# Patient Record
Sex: Female | Born: 1985 | Race: White | Hispanic: No | Marital: Single | Smoking: Current every day smoker
Health system: Southern US, Community
[De-identification: ages and names within clinical notes are randomized; demographics above are authoritative.]

## PROBLEM LIST (undated history)

## (undated) DIAGNOSIS — F1910 Other psychoactive substance abuse, uncomplicated: Secondary | ICD-10-CM

## (undated) DIAGNOSIS — I10 Essential (primary) hypertension: Secondary | ICD-10-CM

## (undated) DIAGNOSIS — E282 Polycystic ovarian syndrome: Secondary | ICD-10-CM

## (undated) DIAGNOSIS — M419 Scoliosis, unspecified: Secondary | ICD-10-CM

## (undated) DIAGNOSIS — F1120 Opioid dependence, uncomplicated: Secondary | ICD-10-CM

## (undated) DIAGNOSIS — M25569 Pain in unspecified knee: Secondary | ICD-10-CM

## (undated) DIAGNOSIS — M549 Dorsalgia, unspecified: Secondary | ICD-10-CM

## (undated) DIAGNOSIS — G8929 Other chronic pain: Secondary | ICD-10-CM

## (undated) HISTORY — PX: GANGLION CYST EXCISION: SHX1691

## (undated) HISTORY — PX: MULTIPLE TOOTH EXTRACTIONS: SHX2053

## (undated) HISTORY — PX: KNEE SURGERY: SHX244

## (undated) HISTORY — DX: Polycystic ovarian syndrome: E28.2

---
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
## 2004-01-27 ENCOUNTER — Emergency Department (HOSPITAL_COMMUNITY): Admission: EM | Admit: 2004-01-27 | Discharge: 2004-01-27 | Payer: Self-pay | Admitting: Emergency Medicine

## 2004-03-16 ENCOUNTER — Emergency Department (HOSPITAL_COMMUNITY): Admission: EM | Admit: 2004-03-16 | Discharge: 2004-03-16 | Payer: Self-pay | Admitting: Emergency Medicine

## 2004-06-20 ENCOUNTER — Emergency Department (HOSPITAL_COMMUNITY): Admission: EM | Admit: 2004-06-20 | Discharge: 2004-06-20 | Payer: Self-pay | Admitting: Emergency Medicine

## 2004-10-13 ENCOUNTER — Emergency Department (HOSPITAL_COMMUNITY): Admission: EM | Admit: 2004-10-13 | Discharge: 2004-10-14 | Payer: Self-pay | Admitting: Emergency Medicine

## 2004-11-28 ENCOUNTER — Emergency Department (HOSPITAL_COMMUNITY): Admission: EM | Admit: 2004-11-28 | Discharge: 2004-11-28 | Payer: Self-pay | Admitting: Emergency Medicine

## 2004-12-10 ENCOUNTER — Emergency Department (HOSPITAL_COMMUNITY): Admission: EM | Admit: 2004-12-10 | Discharge: 2004-12-10 | Payer: Self-pay | Admitting: Emergency Medicine

## 2005-01-20 ENCOUNTER — Emergency Department (HOSPITAL_COMMUNITY): Admission: EM | Admit: 2005-01-20 | Discharge: 2005-01-20 | Payer: Self-pay | Admitting: Emergency Medicine

## 2005-01-23 ENCOUNTER — Ambulatory Visit: Payer: Self-pay | Admitting: Internal Medicine

## 2005-01-31 ENCOUNTER — Ambulatory Visit: Payer: Self-pay | Admitting: Internal Medicine

## 2005-02-01 ENCOUNTER — Emergency Department (HOSPITAL_COMMUNITY): Admission: EM | Admit: 2005-02-01 | Discharge: 2005-02-01 | Payer: Self-pay | Admitting: Emergency Medicine

## 2005-02-07 ENCOUNTER — Ambulatory Visit (HOSPITAL_BASED_OUTPATIENT_CLINIC_OR_DEPARTMENT_OTHER): Admission: RE | Admit: 2005-02-07 | Discharge: 2005-02-07 | Payer: Self-pay | Admitting: Otolaryngology

## 2005-02-07 ENCOUNTER — Ambulatory Visit (HOSPITAL_COMMUNITY): Admission: RE | Admit: 2005-02-07 | Discharge: 2005-02-07 | Payer: Self-pay | Admitting: Otolaryngology

## 2005-02-08 ENCOUNTER — Emergency Department (HOSPITAL_COMMUNITY): Admission: EM | Admit: 2005-02-08 | Discharge: 2005-02-08 | Payer: Self-pay | Admitting: *Deleted

## 2005-03-02 ENCOUNTER — Emergency Department (HOSPITAL_COMMUNITY): Admission: EM | Admit: 2005-03-02 | Discharge: 2005-03-02 | Payer: Self-pay | Admitting: Emergency Medicine

## 2005-03-09 ENCOUNTER — Emergency Department (HOSPITAL_COMMUNITY): Admission: EM | Admit: 2005-03-09 | Discharge: 2005-03-09 | Payer: Self-pay | Admitting: Emergency Medicine

## 2005-04-29 ENCOUNTER — Emergency Department (HOSPITAL_COMMUNITY): Admission: EM | Admit: 2005-04-29 | Discharge: 2005-04-29 | Payer: Self-pay | Admitting: Emergency Medicine

## 2005-07-22 ENCOUNTER — Emergency Department (HOSPITAL_COMMUNITY): Admission: EM | Admit: 2005-07-22 | Discharge: 2005-07-22 | Payer: Self-pay | Admitting: Family Medicine

## 2005-08-11 ENCOUNTER — Emergency Department (HOSPITAL_COMMUNITY): Admission: EM | Admit: 2005-08-11 | Discharge: 2005-08-11 | Payer: Self-pay | Admitting: Family Medicine

## 2005-08-21 ENCOUNTER — Emergency Department (HOSPITAL_COMMUNITY): Admission: EM | Admit: 2005-08-21 | Discharge: 2005-08-21 | Payer: Self-pay | Admitting: Emergency Medicine

## 2005-09-09 ENCOUNTER — Emergency Department (HOSPITAL_COMMUNITY): Admission: EM | Admit: 2005-09-09 | Discharge: 2005-09-09 | Payer: Self-pay | Admitting: Emergency Medicine

## 2005-09-19 ENCOUNTER — Ambulatory Visit: Payer: Self-pay | Admitting: Family Medicine

## 2005-09-26 ENCOUNTER — Emergency Department (HOSPITAL_COMMUNITY): Admission: EM | Admit: 2005-09-26 | Discharge: 2005-09-26 | Payer: Self-pay | Admitting: Family Medicine

## 2005-09-26 ENCOUNTER — Ambulatory Visit: Payer: Self-pay | Admitting: Family Medicine

## 2005-09-29 ENCOUNTER — Ambulatory Visit: Payer: Self-pay | Admitting: Family Medicine

## 2005-10-09 ENCOUNTER — Ambulatory Visit: Payer: Self-pay | Admitting: Internal Medicine

## 2005-11-25 ENCOUNTER — Emergency Department (HOSPITAL_COMMUNITY): Admission: EM | Admit: 2005-11-25 | Discharge: 2005-11-25 | Payer: Self-pay | Admitting: Emergency Medicine

## 2005-12-22 ENCOUNTER — Ambulatory Visit: Payer: Self-pay | Admitting: Family Medicine

## 2006-01-16 ENCOUNTER — Encounter: Payer: Self-pay | Admitting: Family Medicine

## 2006-01-16 ENCOUNTER — Ambulatory Visit: Payer: Self-pay | Admitting: Family Medicine

## 2006-02-13 ENCOUNTER — Ambulatory Visit: Payer: Self-pay | Admitting: Internal Medicine

## 2006-03-10 ENCOUNTER — Ambulatory Visit: Payer: Self-pay | Admitting: Family Medicine

## 2006-03-13 ENCOUNTER — Ambulatory Visit (HOSPITAL_COMMUNITY): Admission: RE | Admit: 2006-03-13 | Discharge: 2006-03-13 | Payer: Self-pay | Admitting: Family Medicine

## 2006-03-16 ENCOUNTER — Ambulatory Visit: Payer: Self-pay | Admitting: Family Medicine

## 2006-04-21 ENCOUNTER — Ambulatory Visit: Payer: Self-pay | Admitting: Family Medicine

## 2006-05-06 ENCOUNTER — Ambulatory Visit: Payer: Self-pay | Admitting: *Deleted

## 2006-05-20 ENCOUNTER — Ambulatory Visit: Payer: Self-pay | Admitting: Internal Medicine

## 2006-06-03 ENCOUNTER — Emergency Department (HOSPITAL_COMMUNITY): Admission: EM | Admit: 2006-06-03 | Discharge: 2006-06-03 | Payer: Self-pay | Admitting: Emergency Medicine

## 2006-06-08 ENCOUNTER — Ambulatory Visit: Payer: Self-pay | Admitting: Family Medicine

## 2006-06-11 ENCOUNTER — Ambulatory Visit: Payer: Self-pay | Admitting: Family Medicine

## 2006-08-05 ENCOUNTER — Emergency Department (HOSPITAL_COMMUNITY): Admission: EM | Admit: 2006-08-05 | Discharge: 2006-08-05 | Payer: Self-pay | Admitting: Emergency Medicine

## 2006-08-06 ENCOUNTER — Ambulatory Visit: Payer: Self-pay | Admitting: Internal Medicine

## 2006-08-16 ENCOUNTER — Emergency Department (HOSPITAL_COMMUNITY): Admission: EM | Admit: 2006-08-16 | Discharge: 2006-08-16 | Payer: Self-pay | Admitting: Emergency Medicine

## 2006-08-19 ENCOUNTER — Encounter: Admission: RE | Admit: 2006-08-19 | Discharge: 2006-10-15 | Payer: Self-pay | Admitting: Family Medicine

## 2006-08-19 ENCOUNTER — Encounter: Payer: Self-pay | Admitting: Family Medicine

## 2006-08-22 ENCOUNTER — Emergency Department (HOSPITAL_COMMUNITY): Admission: EM | Admit: 2006-08-22 | Discharge: 2006-08-22 | Payer: Self-pay | Admitting: Emergency Medicine

## 2006-09-05 ENCOUNTER — Ambulatory Visit (HOSPITAL_COMMUNITY): Admission: RE | Admit: 2006-09-05 | Discharge: 2006-09-05 | Payer: Self-pay | Admitting: Specialist

## 2006-09-06 ENCOUNTER — Emergency Department (HOSPITAL_COMMUNITY): Admission: EM | Admit: 2006-09-06 | Discharge: 2006-09-06 | Payer: Self-pay | Admitting: Emergency Medicine

## 2006-10-15 ENCOUNTER — Encounter: Payer: Self-pay | Admitting: Family Medicine

## 2006-11-07 ENCOUNTER — Emergency Department (HOSPITAL_COMMUNITY): Admission: EM | Admit: 2006-11-07 | Discharge: 2006-11-07 | Payer: Self-pay | Admitting: Family Medicine

## 2006-11-26 ENCOUNTER — Emergency Department (HOSPITAL_COMMUNITY): Admission: EM | Admit: 2006-11-26 | Discharge: 2006-11-26 | Payer: Self-pay | Admitting: Emergency Medicine

## 2007-01-27 ENCOUNTER — Encounter (INDEPENDENT_AMBULATORY_CARE_PROVIDER_SITE_OTHER): Payer: Self-pay | Admitting: *Deleted

## 2007-02-02 DIAGNOSIS — Z87891 Personal history of nicotine dependence: Secondary | ICD-10-CM | POA: Insufficient documentation

## 2007-02-02 DIAGNOSIS — I10 Essential (primary) hypertension: Secondary | ICD-10-CM | POA: Insufficient documentation

## 2007-02-02 DIAGNOSIS — E282 Polycystic ovarian syndrome: Secondary | ICD-10-CM | POA: Insufficient documentation

## 2007-02-02 DIAGNOSIS — J45909 Unspecified asthma, uncomplicated: Secondary | ICD-10-CM | POA: Insufficient documentation

## 2007-02-02 DIAGNOSIS — F319 Bipolar disorder, unspecified: Secondary | ICD-10-CM | POA: Insufficient documentation

## 2007-02-02 DIAGNOSIS — F1910 Other psychoactive substance abuse, uncomplicated: Secondary | ICD-10-CM | POA: Insufficient documentation

## 2007-02-22 ENCOUNTER — Emergency Department (HOSPITAL_COMMUNITY): Admission: EM | Admit: 2007-02-22 | Discharge: 2007-02-22 | Payer: Self-pay | Admitting: Emergency Medicine

## 2007-04-01 ENCOUNTER — Ambulatory Visit: Payer: Self-pay | Admitting: Internal Medicine

## 2007-04-01 DIAGNOSIS — J309 Allergic rhinitis, unspecified: Secondary | ICD-10-CM | POA: Insufficient documentation

## 2007-04-01 DIAGNOSIS — IMO0002 Reserved for concepts with insufficient information to code with codable children: Secondary | ICD-10-CM | POA: Insufficient documentation

## 2007-04-01 DIAGNOSIS — J019 Acute sinusitis, unspecified: Secondary | ICD-10-CM | POA: Insufficient documentation

## 2007-04-11 ENCOUNTER — Emergency Department (HOSPITAL_COMMUNITY): Admission: EM | Admit: 2007-04-11 | Discharge: 2007-04-11 | Payer: Self-pay | Admitting: Emergency Medicine

## 2007-04-11 IMAGING — CR DG KNEE COMPLETE 4+V*R*
4 series · 4 of 4 positions shown · non-contrast
Comparison: None available.

CLINICAL DATA: 21-year-old who fell.  Right knee pain.  History of an ACL repair. 
 RIGHT KNEE ? 4 VIEW:

[view not recorded (1 of 4)]
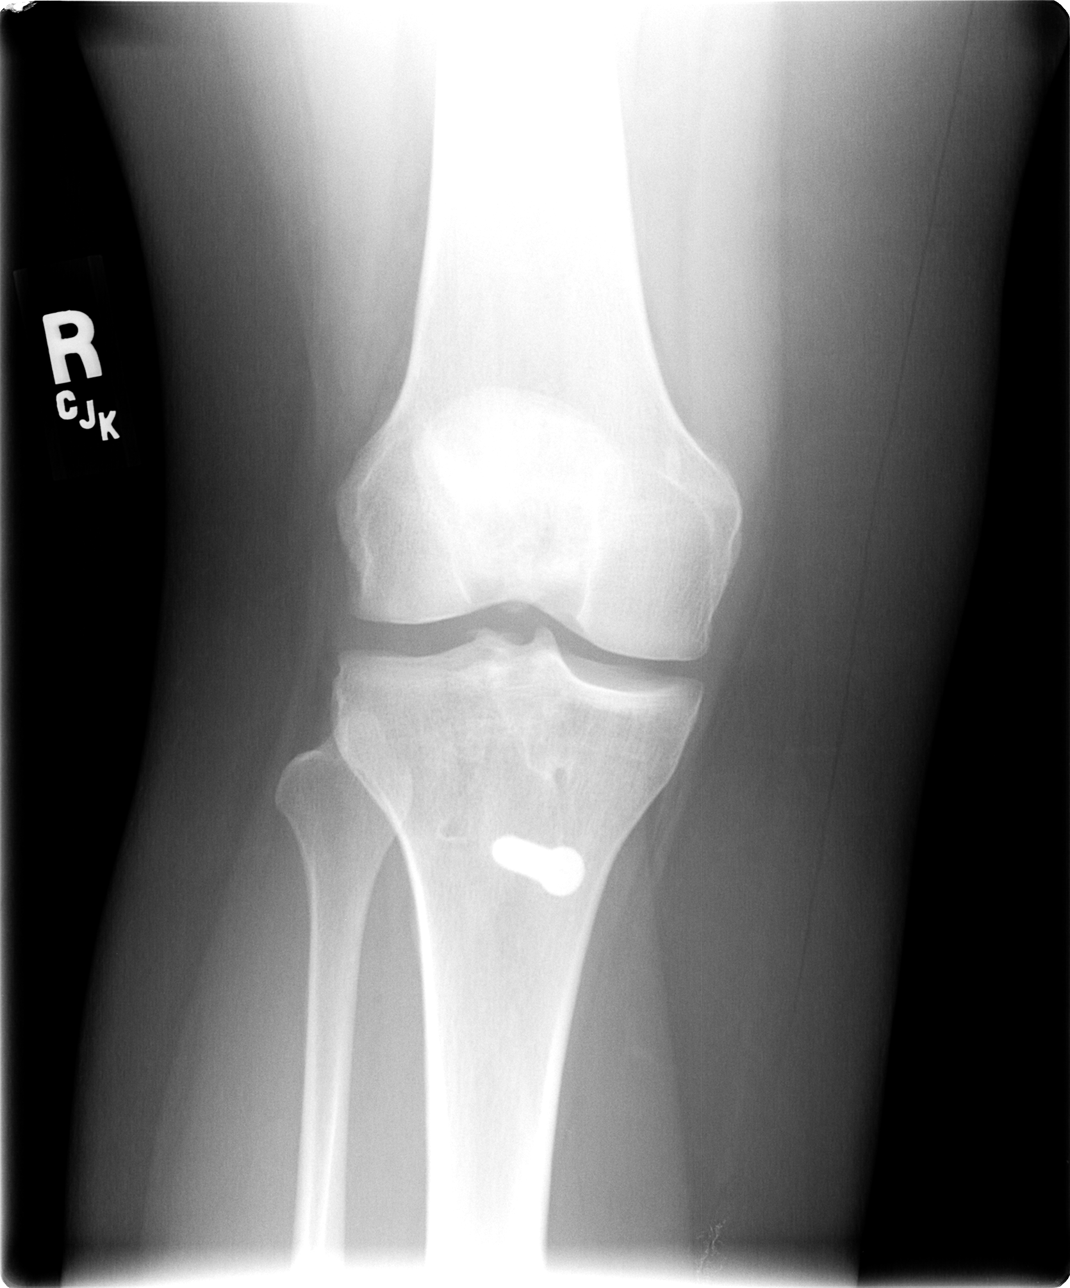

[view not recorded (2 of 4)]
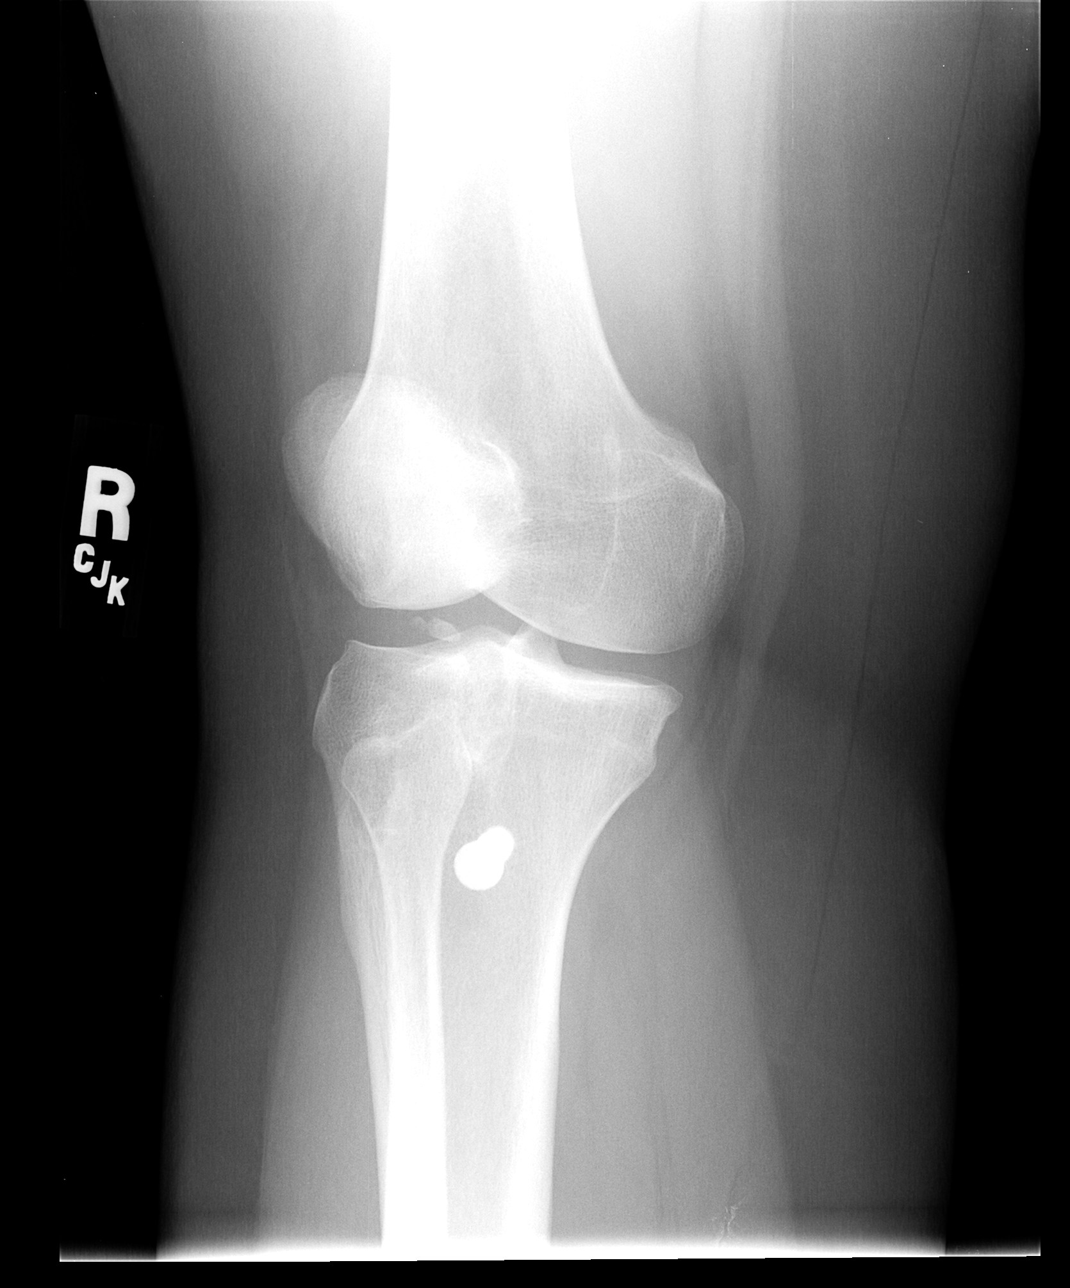

[view not recorded (3 of 4)]
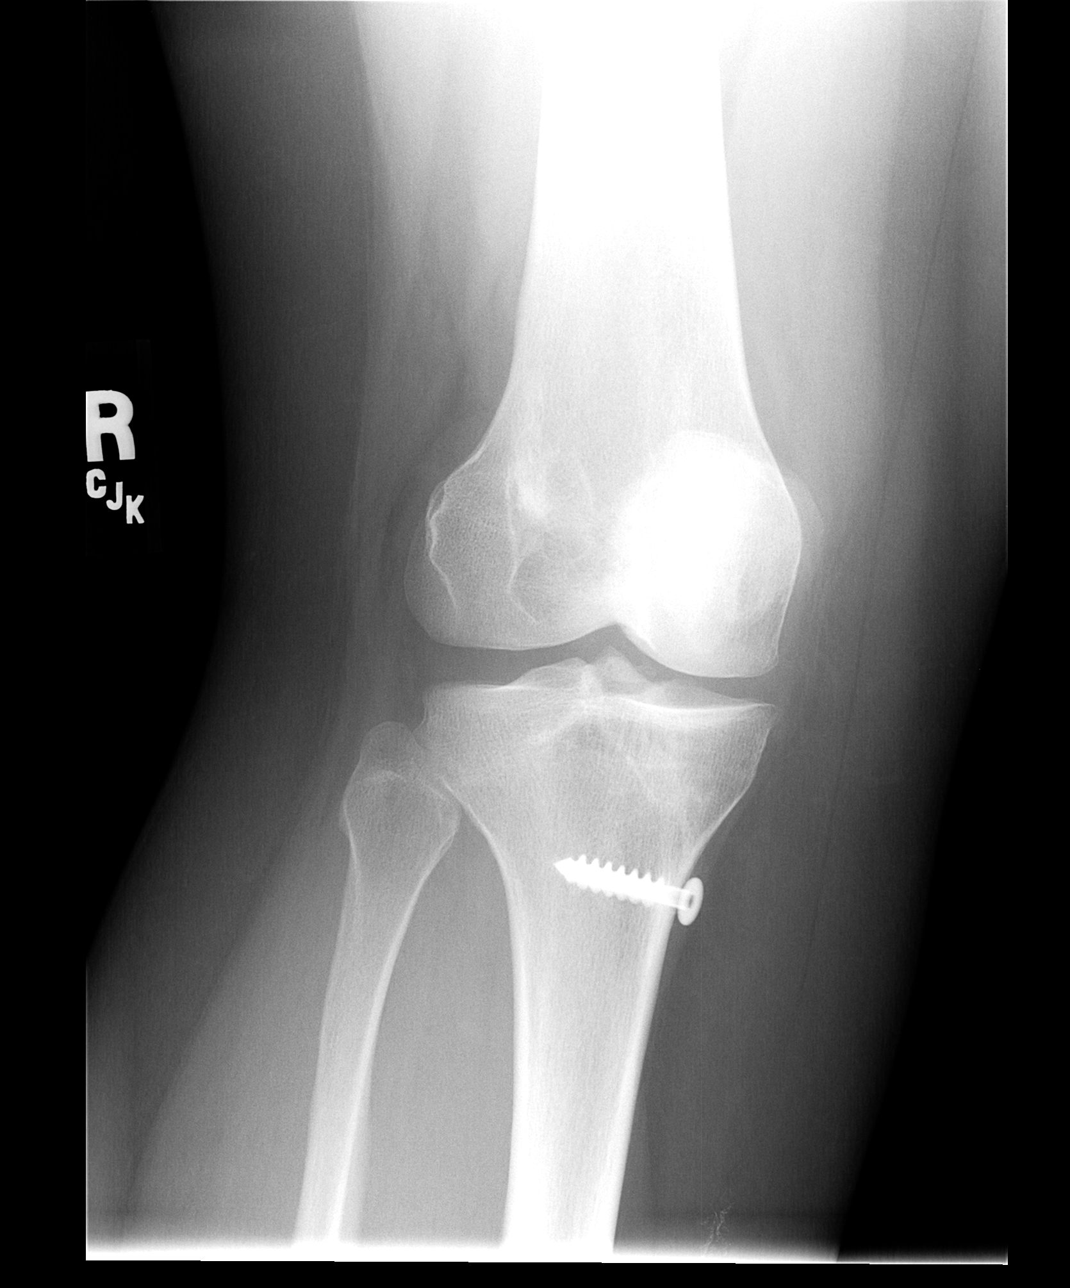

[view not recorded (4 of 4)]
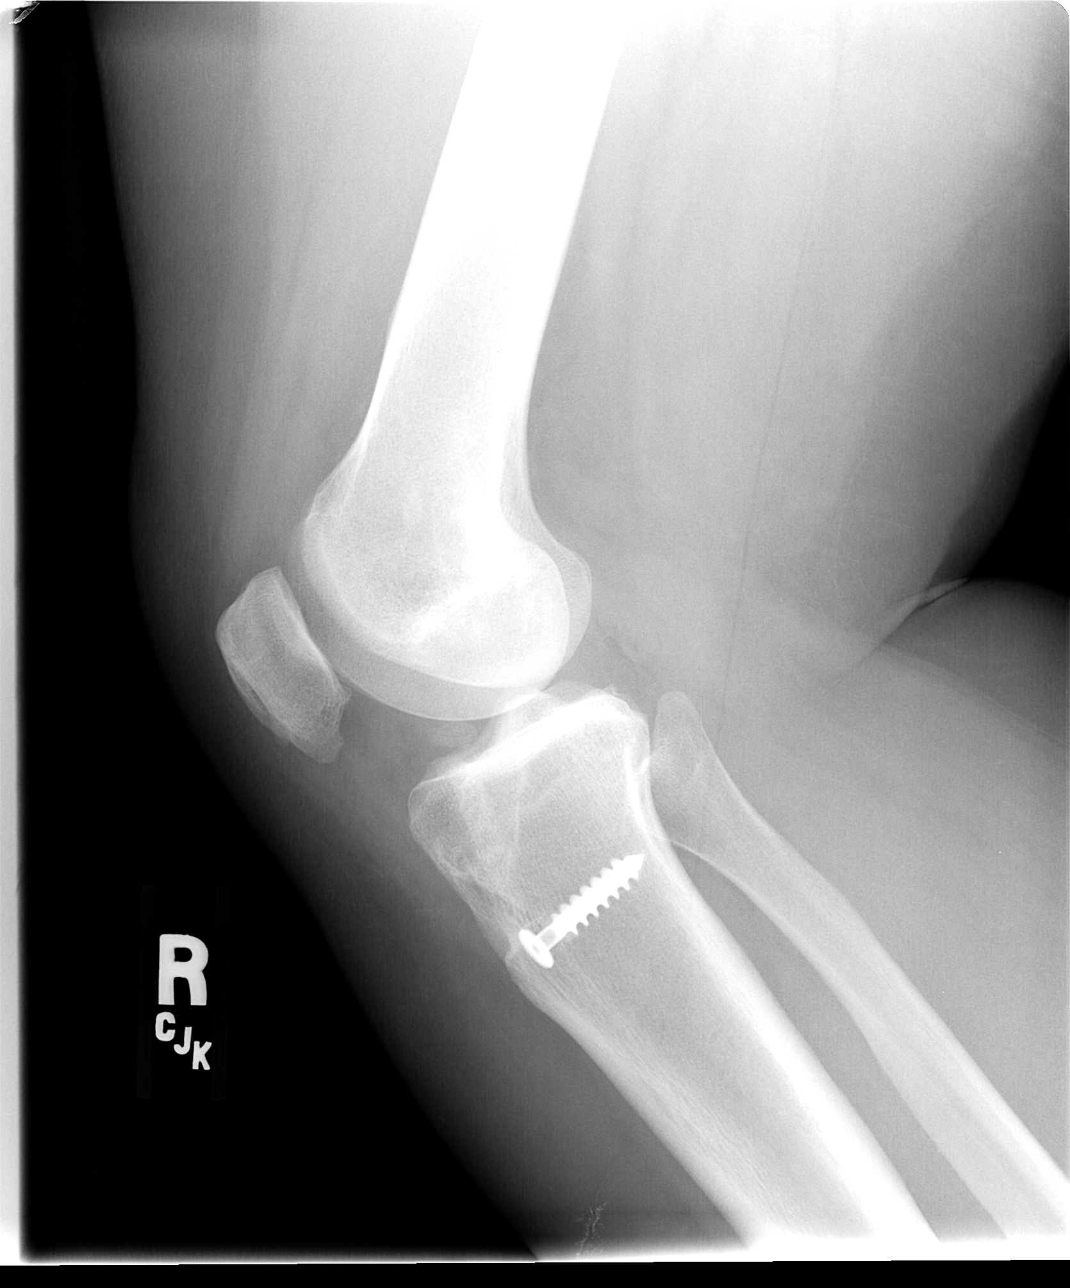

[4 of 4 positions shown; findings below may reference images not displayed]

FINDINGS: Surgical changes are noted.  No fractures are seen.  No joint effusion.  There is a rounded density in the knee joint which is likely postoperative.
IMPRESSION: 1.  No acute bony findings and no joint effusion. 
 2.  Postoperative changes with a rounded loose body in the joint.

## 2007-04-30 ENCOUNTER — Ambulatory Visit: Payer: Self-pay | Admitting: Internal Medicine

## 2007-04-30 ENCOUNTER — Encounter (INDEPENDENT_AMBULATORY_CARE_PROVIDER_SITE_OTHER): Payer: Self-pay | Admitting: Internal Medicine

## 2007-04-30 LAB — CONVERTED CEMR LAB
Glucose, Urine, Semiquant: NEGATIVE
Nitrite: NEGATIVE
pH: 5.5

## 2007-05-01 ENCOUNTER — Encounter (INDEPENDENT_AMBULATORY_CARE_PROVIDER_SITE_OTHER): Payer: Self-pay | Admitting: Internal Medicine

## 2007-05-04 LAB — CONVERTED CEMR LAB
ALT: 11 units/L (ref 0–35)
BUN: 10 mg/dL (ref 6–23)
Basophils Absolute: 0 10*3/uL (ref 0.0–0.1)
CO2: 25 meq/L (ref 19–32)
Calcium: 9.2 mg/dL (ref 8.4–10.5)
Chloride: 107 meq/L (ref 96–112)
Cholesterol: 154 mg/dL (ref 0–200)
Creatinine, Ser: 0.65 mg/dL (ref 0.40–1.20)
Eosinophils Relative: 2 % (ref 0–5)
GC Probe Amp, Genital: NEGATIVE
HCT: 44.7 % (ref 36.0–46.0)
HDL: 55 mg/dL (ref >39)
Hemoglobin: 13.7 g/dL (ref 12.0–15.0)
Lymphocytes Relative: 24 % (ref 12–46)
MCHC: 30.6 g/dL (ref 30.0–36.0)
Monocytes Absolute: 0.6 10*3/uL (ref 0.1–1.0)
Monocytes Relative: 5 % (ref 3–12)
Neutro Abs: 8.2 10*3/uL — ABNORMAL HIGH (ref 1.7–7.7)
RBC: 4.47 M/uL (ref 3.87–5.11)
RDW: 14.6 % (ref 11.5–15.5)
Total Bilirubin: 0.3 mg/dL (ref 0.3–1.2)
Total CHOL/HDL Ratio: 2.8
Triglycerides: 75 mg/dL (ref <150)
VLDL: 15 mg/dL (ref 0–40)

## 2007-05-19 ENCOUNTER — Encounter (INDEPENDENT_AMBULATORY_CARE_PROVIDER_SITE_OTHER): Payer: Self-pay | Admitting: Internal Medicine

## 2007-05-21 ENCOUNTER — Ambulatory Visit: Payer: Self-pay | Admitting: Internal Medicine

## 2007-05-24 ENCOUNTER — Emergency Department (HOSPITAL_COMMUNITY): Admission: EM | Admit: 2007-05-24 | Discharge: 2007-05-24 | Payer: Self-pay | Admitting: Emergency Medicine

## 2007-06-07 ENCOUNTER — Emergency Department (HOSPITAL_COMMUNITY): Admission: EM | Admit: 2007-06-07 | Discharge: 2007-06-07 | Payer: Self-pay | Admitting: Emergency Medicine

## 2007-06-23 ENCOUNTER — Emergency Department (HOSPITAL_COMMUNITY): Admission: EM | Admit: 2007-06-23 | Discharge: 2007-06-23 | Payer: Self-pay | Admitting: Emergency Medicine

## 2007-07-12 ENCOUNTER — Emergency Department (HOSPITAL_COMMUNITY): Admission: EM | Admit: 2007-07-12 | Discharge: 2007-07-12 | Payer: Self-pay | Admitting: Emergency Medicine

## 2007-07-24 ENCOUNTER — Emergency Department (HOSPITAL_COMMUNITY): Admission: EM | Admit: 2007-07-24 | Discharge: 2007-07-24 | Payer: Self-pay | Admitting: Emergency Medicine

## 2007-08-02 ENCOUNTER — Emergency Department (HOSPITAL_COMMUNITY): Admission: EM | Admit: 2007-08-02 | Discharge: 2007-08-02 | Payer: Self-pay | Admitting: Emergency Medicine

## 2007-08-19 ENCOUNTER — Telehealth (INDEPENDENT_AMBULATORY_CARE_PROVIDER_SITE_OTHER): Payer: Self-pay | Admitting: Internal Medicine

## 2007-09-02 ENCOUNTER — Emergency Department (HOSPITAL_COMMUNITY): Admission: EM | Admit: 2007-09-02 | Discharge: 2007-09-02 | Payer: Self-pay | Admitting: Emergency Medicine

## 2007-09-20 ENCOUNTER — Emergency Department (HOSPITAL_COMMUNITY): Admission: EM | Admit: 2007-09-20 | Discharge: 2007-09-20 | Payer: Self-pay | Admitting: Emergency Medicine

## 2007-09-24 ENCOUNTER — Emergency Department (HOSPITAL_COMMUNITY): Admission: EM | Admit: 2007-09-24 | Discharge: 2007-09-24 | Payer: Self-pay | Admitting: Emergency Medicine

## 2007-09-30 ENCOUNTER — Emergency Department (HOSPITAL_COMMUNITY): Admission: EM | Admit: 2007-09-30 | Discharge: 2007-09-30 | Payer: Self-pay | Admitting: Emergency Medicine

## 2007-11-15 ENCOUNTER — Emergency Department (HOSPITAL_COMMUNITY): Admission: EM | Admit: 2007-11-15 | Discharge: 2007-11-15 | Payer: Self-pay | Admitting: Emergency Medicine

## 2007-12-20 ENCOUNTER — Emergency Department (HOSPITAL_COMMUNITY): Admission: EM | Admit: 2007-12-20 | Discharge: 2007-12-20 | Payer: Self-pay | Admitting: Emergency Medicine

## 2008-01-18 ENCOUNTER — Emergency Department (HOSPITAL_COMMUNITY): Admission: EM | Admit: 2008-01-18 | Discharge: 2008-01-18 | Payer: Self-pay | Admitting: Emergency Medicine

## 2008-05-08 ENCOUNTER — Emergency Department (HOSPITAL_COMMUNITY): Admission: EM | Admit: 2008-05-08 | Discharge: 2008-05-08 | Payer: Self-pay | Admitting: Emergency Medicine

## 2008-07-30 ENCOUNTER — Emergency Department (HOSPITAL_COMMUNITY): Admission: EM | Admit: 2008-07-30 | Discharge: 2008-07-31 | Payer: Self-pay | Admitting: Emergency Medicine

## 2008-08-24 ENCOUNTER — Ambulatory Visit: Payer: Self-pay | Admitting: Internal Medicine

## 2008-08-24 LAB — CONVERTED CEMR LAB: Blood Glucose, Fingerstick: 87

## 2008-09-11 ENCOUNTER — Emergency Department (HOSPITAL_COMMUNITY): Admission: EM | Admit: 2008-09-11 | Discharge: 2008-09-11 | Payer: Self-pay | Admitting: Emergency Medicine

## 2008-09-18 ENCOUNTER — Telehealth (INDEPENDENT_AMBULATORY_CARE_PROVIDER_SITE_OTHER): Payer: Self-pay | Admitting: Internal Medicine

## 2008-10-08 ENCOUNTER — Emergency Department (HOSPITAL_BASED_OUTPATIENT_CLINIC_OR_DEPARTMENT_OTHER): Admission: EM | Admit: 2008-10-08 | Discharge: 2008-10-08 | Payer: Self-pay | Admitting: Emergency Medicine

## 2008-10-26 ENCOUNTER — Encounter: Payer: Self-pay | Admitting: Internal Medicine

## 2008-10-26 LAB — CONVERTED CEMR LAB: Beta hcg, urine, semiquantitative: NEGATIVE

## 2008-12-05 ENCOUNTER — Emergency Department (HOSPITAL_BASED_OUTPATIENT_CLINIC_OR_DEPARTMENT_OTHER): Admission: EM | Admit: 2008-12-05 | Discharge: 2008-12-05 | Payer: Self-pay | Admitting: Emergency Medicine

## 2008-12-14 ENCOUNTER — Encounter (INDEPENDENT_AMBULATORY_CARE_PROVIDER_SITE_OTHER): Payer: Self-pay | Admitting: *Deleted

## 2008-12-27 ENCOUNTER — Emergency Department (HOSPITAL_BASED_OUTPATIENT_CLINIC_OR_DEPARTMENT_OTHER): Admission: EM | Admit: 2008-12-27 | Discharge: 2008-12-27 | Payer: Self-pay | Admitting: Emergency Medicine

## 2009-03-06 ENCOUNTER — Ambulatory Visit: Payer: Self-pay | Admitting: Internal Medicine

## 2009-03-06 DIAGNOSIS — J209 Acute bronchitis, unspecified: Secondary | ICD-10-CM | POA: Insufficient documentation

## 2009-03-08 ENCOUNTER — Telehealth (INDEPENDENT_AMBULATORY_CARE_PROVIDER_SITE_OTHER): Payer: Self-pay | Admitting: Internal Medicine

## 2009-03-11 ENCOUNTER — Ambulatory Visit (HOSPITAL_COMMUNITY): Admission: RE | Admit: 2009-03-11 | Discharge: 2009-03-11 | Payer: Self-pay | Admitting: Internal Medicine

## 2009-04-16 ENCOUNTER — Emergency Department (HOSPITAL_COMMUNITY): Admission: EM | Admit: 2009-04-16 | Discharge: 2009-04-16 | Payer: Self-pay | Admitting: Emergency Medicine

## 2009-06-02 ENCOUNTER — Emergency Department (HOSPITAL_COMMUNITY): Admission: EM | Admit: 2009-06-02 | Discharge: 2009-06-02 | Payer: Self-pay | Admitting: Emergency Medicine

## 2009-11-28 ENCOUNTER — Emergency Department (HOSPITAL_BASED_OUTPATIENT_CLINIC_OR_DEPARTMENT_OTHER): Admission: EM | Admit: 2009-11-28 | Discharge: 2009-11-28 | Payer: Self-pay | Admitting: Emergency Medicine

## 2009-12-25 ENCOUNTER — Inpatient Hospital Stay (HOSPITAL_COMMUNITY): Admission: AD | Admit: 2009-12-25 | Discharge: 2009-12-25 | Payer: Self-pay | Admitting: Family Medicine

## 2009-12-25 ENCOUNTER — Ambulatory Visit: Payer: Self-pay | Admitting: Nurse Practitioner

## 2010-03-20 ENCOUNTER — Encounter: Payer: Self-pay | Admitting: Physician Assistant

## 2010-03-20 ENCOUNTER — Encounter: Payer: Self-pay | Admitting: Obstetrics and Gynecology

## 2010-03-20 ENCOUNTER — Ambulatory Visit: Payer: Self-pay | Admitting: Obstetrics and Gynecology

## 2010-03-20 LAB — CONVERTED CEMR LAB
ALT: 17 units/L (ref 0–35)
AST: 20 units/L (ref 0–37)
Albumin: 3.9 g/dL (ref 3.5–5.2)
Alkaline Phosphatase: 134 units/L — ABNORMAL HIGH (ref 39–117)
Antibody Screen: NEGATIVE
BUN: 7 mg/dL (ref 6–23)
Creatinine, Ser: 0.5 mg/dL (ref 0.40–1.20)
Eosinophils Absolute: 0.3 10*3/uL (ref 0.0–0.7)
Eosinophils Relative: 2 % (ref 0–5)
HCT: 42.8 % (ref 36.0–46.0)
Hemoglobin: 14.3 g/dL (ref 12.0–15.0)
Hepatitis B Surface Ag: NEGATIVE
Lymphocytes Relative: 34 % (ref 12–46)
Lymphs Abs: 4.6 10*3/uL — ABNORMAL HIGH (ref 0.7–4.0)
MCV: 92.2 fL (ref 78.0–100.0)
Monocytes Relative: 5 % (ref 3–12)
Platelets: 304 10*3/uL (ref 150–400)
Potassium: 4.6 meq/L (ref 3.5–5.3)
RBC: 4.64 M/uL (ref 3.87–5.11)
Rh Type: POSITIVE
WBC: 13.8 10*3/uL — ABNORMAL HIGH (ref 4.0–10.5)

## 2010-03-21 ENCOUNTER — Encounter (INDEPENDENT_AMBULATORY_CARE_PROVIDER_SITE_OTHER): Payer: Self-pay | Admitting: *Deleted

## 2010-03-21 LAB — CONVERTED CEMR LAB
Creatinine 24 HR UR: 1456 mg/24hr (ref 700–1800)
Creatinine, Urine: 121.4 mg/dL
Protein, Ur: 72 mg/24hr (ref 50–100)

## 2010-03-22 ENCOUNTER — Ambulatory Visit (HOSPITAL_COMMUNITY): Admission: RE | Admit: 2010-03-22 | Discharge: 2010-03-22 | Payer: Self-pay | Admitting: Family Medicine

## 2010-04-11 ENCOUNTER — Ambulatory Visit: Payer: Self-pay | Admitting: Family Medicine

## 2010-04-25 ENCOUNTER — Ambulatory Visit: Payer: Self-pay | Admitting: Obstetrics & Gynecology

## 2010-05-02 ENCOUNTER — Ambulatory Visit: Payer: Self-pay | Admitting: Family Medicine

## 2010-05-09 ENCOUNTER — Ambulatory Visit: Payer: Self-pay | Admitting: Obstetrics & Gynecology

## 2010-05-09 ENCOUNTER — Ambulatory Visit (HOSPITAL_COMMUNITY)
Admission: RE | Admit: 2010-05-09 | Discharge: 2010-05-09 | Payer: Self-pay | Source: Home / Self Care | Attending: Family Medicine | Admitting: Family Medicine

## 2010-05-09 ENCOUNTER — Inpatient Hospital Stay (HOSPITAL_COMMUNITY)
Admission: AD | Admit: 2010-05-09 | Discharge: 2010-05-21 | Payer: Self-pay | Source: Home / Self Care | Attending: Family Medicine | Admitting: Family Medicine

## 2010-05-09 ENCOUNTER — Encounter: Payer: Self-pay | Admitting: Obstetrics & Gynecology

## 2010-05-09 LAB — CONVERTED CEMR LAB
HCT: 37.9 % (ref 36.0–46.0)
Hemoglobin: 12.3 g/dL (ref 12.0–15.0)
Platelets: 258 10*3/uL (ref 150–400)
RDW: 14.1 % (ref 11.5–15.5)
WBC: 10.8 10*3/uL — ABNORMAL HIGH (ref 4.0–10.5)

## 2010-05-10 ENCOUNTER — Ambulatory Visit (HOSPITAL_COMMUNITY)
Admission: RE | Admit: 2010-05-10 | Discharge: 2010-05-10 | Payer: Self-pay | Source: Home / Self Care | Attending: Family Medicine | Admitting: Family Medicine

## 2010-05-10 ENCOUNTER — Encounter: Payer: Self-pay | Admitting: Family Medicine

## 2010-05-11 ENCOUNTER — Encounter: Payer: Self-pay | Admitting: Family Medicine

## 2010-05-14 ENCOUNTER — Ambulatory Visit (HOSPITAL_COMMUNITY)
Admission: RE | Admit: 2010-05-14 | Discharge: 2010-05-14 | Payer: Self-pay | Source: Home / Self Care | Attending: Family Medicine | Admitting: Family Medicine

## 2010-05-14 ENCOUNTER — Encounter: Payer: Self-pay | Admitting: Family Medicine

## 2010-05-15 LAB — CREATININE CLEARANCE, URINE, 24 HOUR
Collection Interval-CRCL: 24 hours
Creatinine Clearance: 264 mL/min — ABNORMAL HIGH (ref 75–115)
Creatinine, 24H Ur: 1406 mg/d (ref 700–1800)
Creatinine, Urine: 70.3 mg/dL
Creatinine: 0.37 mg/dL — ABNORMAL LOW (ref 0.4–1.2)
Urine Total Volume-CRCL: 2000 mL

## 2010-05-15 LAB — PROTEIN, URINE, 24 HOUR
Collection Interval-UPROT: 24 hours
Protein, Ur: 120 mg/d — ABNORMAL HIGH (ref 50–100)
Protein, Urine: 6 mg/dL
Urine Total Volume-UPROT: 2000 mL

## 2010-05-17 ENCOUNTER — Encounter: Payer: Self-pay | Admitting: Family Medicine

## 2010-05-17 ENCOUNTER — Ambulatory Visit (HOSPITAL_COMMUNITY)
Admission: RE | Admit: 2010-05-17 | Discharge: 2010-05-17 | Payer: Self-pay | Source: Home / Self Care | Attending: Family Medicine | Admitting: Family Medicine

## 2010-05-21 ENCOUNTER — Ambulatory Visit (HOSPITAL_COMMUNITY)
Admission: RE | Admit: 2010-05-21 | Discharge: 2010-05-21 | Payer: Self-pay | Source: Home / Self Care | Attending: Obstetrics & Gynecology | Admitting: Obstetrics & Gynecology

## 2010-05-21 ENCOUNTER — Encounter: Payer: Self-pay | Admitting: Family Medicine

## 2010-05-23 ENCOUNTER — Inpatient Hospital Stay (HOSPITAL_COMMUNITY)
Admission: AD | Admit: 2010-05-23 | Discharge: 2010-05-23 | Payer: Self-pay | Source: Home / Self Care | Attending: Obstetrics and Gynecology | Admitting: Obstetrics and Gynecology

## 2010-05-23 ENCOUNTER — Ambulatory Visit
Admission: RE | Admit: 2010-05-23 | Discharge: 2010-05-23 | Payer: Self-pay | Source: Home / Self Care | Attending: Obstetrics and Gynecology | Admitting: Obstetrics and Gynecology

## 2010-05-23 ENCOUNTER — Encounter (INDEPENDENT_AMBULATORY_CARE_PROVIDER_SITE_OTHER): Payer: Self-pay | Admitting: Internal Medicine

## 2010-05-23 LAB — CONVERTED CEMR LAB
Collection Interval-CRCL: 24 hr
Creatinine, Urine: 119.8 mg/dL
Protein, 24H Urine: 104 mg/24hr — ABNORMAL HIGH (ref 50–100)

## 2010-05-24 ENCOUNTER — Encounter: Payer: Self-pay | Admitting: Family Medicine

## 2010-05-24 ENCOUNTER — Ambulatory Visit (HOSPITAL_COMMUNITY)
Admission: RE | Admit: 2010-05-24 | Discharge: 2010-05-24 | Payer: Self-pay | Source: Home / Self Care | Attending: Family Medicine | Admitting: Family Medicine

## 2010-05-27 LAB — CREATININE CLEARANCE, URINE, 24 HOUR
Collection Interval-CRCL: 24 hours
Creatinine Clearance: 279 mL/min — ABNORMAL HIGH (ref 75–115)
Creatinine, 24H Ur: 1408 mg/d (ref 700–1800)
Creatinine, Urine: 44.7 mg/dL
Creatinine: 0.35 mg/dL — ABNORMAL LOW (ref 0.4–1.2)
Urine Total Volume-CRCL: 3150 mL

## 2010-05-27 LAB — CREATININE, SERUM
Creatinine, Ser: 0.35 mg/dL — ABNORMAL LOW (ref 0.4–1.2)
GFR calc Af Amer: >60 mL/min (ref >60)
GFR calc non Af Amer: >60 mL/min (ref >60)

## 2010-05-27 LAB — COMPREHENSIVE METABOLIC PANEL
ALT: 27 U/L (ref 0–35)
ALT: 35 U/L (ref 0–35)
AST: 23 U/L (ref 0–37)
AST: 29 U/L (ref 0–37)
Albumin: 2.7 g/dL — ABNORMAL LOW (ref 3.5–5.2)
Albumin: 3.2 g/dL — ABNORMAL LOW (ref 3.5–5.2)
Alkaline Phosphatase: 163 U/L — ABNORMAL HIGH (ref 39–117)
Alkaline Phosphatase: 189 U/L — ABNORMAL HIGH (ref 39–117)
BUN: 13 mg/dL (ref 6–23)
BUN: 12 mg/dL (ref 6–23)
CO2: 27 mEq/L (ref 19–32)
CO2: 27 mEq/L (ref 19–32)
Calcium: 9.3 mg/dL (ref 8.4–10.5)
Calcium: 9.1 mg/dL (ref 8.4–10.5)
Chloride: 100 mEq/L (ref 96–112)
Chloride: 102 mEq/L (ref 96–112)
Creatinine, Ser: 0.43 mg/dL (ref 0.4–1.2)
Creatinine, Ser: 0.47 mg/dL (ref 0.4–1.2)
GFR calc Af Amer: >60 mL/min (ref >60)
GFR calc Af Amer: >60 mL/min (ref >60)
GFR calc non Af Amer: >60 mL/min (ref >60)
GFR calc non Af Amer: >60 mL/min (ref >60)
Glucose, Bld: 88 mg/dL (ref 70–99)
Glucose, Bld: 82 mg/dL (ref 70–99)
Potassium: 4.8 mEq/L (ref 3.5–5.1)
Potassium: 4.4 mEq/L (ref 3.5–5.1)
Sodium: 133 mEq/L — ABNORMAL LOW (ref 135–145)
Sodium: 135 mEq/L (ref 135–145)
Total Bilirubin: 0.4 mg/dL (ref 0.3–1.2)
Total Bilirubin: 0.2 mg/dL — ABNORMAL LOW (ref 0.3–1.2)
Total Protein: 6.1 g/dL (ref 6.0–8.3)
Total Protein: 7.7 g/dL (ref 6.0–8.3)

## 2010-05-27 LAB — URIC ACID: Uric Acid, Serum: 4.7 mg/dL (ref 2.4–7.0)

## 2010-05-27 LAB — POCT URINALYSIS DIPSTICK
Bilirubin Urine: NEGATIVE
Hgb urine dipstick: NEGATIVE
Nitrite: NEGATIVE
Protein, ur: 30 mg/dL — AB
Specific Gravity, Urine: >=1.03 (ref 1.005–1.030)
Urine Glucose, Fasting: NEGATIVE mg/dL
Urobilinogen, UA: 0.2 mg/dL (ref 0.0–1.0)
pH: 5.5 (ref 5.0–8.0)

## 2010-05-27 LAB — CBC
HCT: 38 % (ref 36.0–46.0)
HCT: 40.8 % (ref 36.0–46.0)
Hemoglobin: 12.7 g/dL (ref 12.0–15.0)
Hemoglobin: 13.9 g/dL (ref 12.0–15.0)
MCH: 30.7 pg (ref 26.0–34.0)
MCH: 31 pg (ref 26.0–34.0)
MCHC: 33.4 g/dL (ref 30.0–36.0)
MCHC: 34.1 g/dL (ref 30.0–36.0)
MCV: 91.8 fL (ref 78.0–100.0)
MCV: 91.1 fL (ref 78.0–100.0)
Platelets: 256 10*3/uL (ref 150–400)
Platelets: 272 10*3/uL (ref 150–400)
RBC: 4.14 MIL/uL (ref 3.87–5.11)
RBC: 4.48 MIL/uL (ref 3.87–5.11)
RDW: 14.3 % (ref 11.5–15.5)
RDW: 13.9 % (ref 11.5–15.5)
WBC: 13.8 10*3/uL — ABNORMAL HIGH (ref 4.0–10.5)
WBC: 12.4 10*3/uL — ABNORMAL HIGH (ref 4.0–10.5)

## 2010-05-27 LAB — PROTEIN / CREATININE RATIO, URINE
Creatinine, Urine: 195.9 mg/dL
Protein Creatinine Ratio: 0.13 (ref 0.00–0.15)
Total Protein, Urine: 25 mg/dL

## 2010-05-27 LAB — PROTEIN, URINE, 24 HOUR
Collection Interval-UPROT: 24 hours
Protein, Ur: 126 mg/d — ABNORMAL HIGH (ref 50–100)
Protein, Urine: 4 mg/dL
Urine Total Volume-UPROT: 3150 mL

## 2010-05-28 ENCOUNTER — Encounter: Payer: Self-pay | Admitting: Family Medicine

## 2010-05-28 ENCOUNTER — Ambulatory Visit (HOSPITAL_COMMUNITY)
Admission: RE | Admit: 2010-05-28 | Discharge: 2010-05-28 | Payer: Self-pay | Source: Home / Self Care | Attending: Family Medicine | Admitting: Family Medicine

## 2010-05-30 ENCOUNTER — Ambulatory Visit (HOSPITAL_COMMUNITY)
Admission: RE | Admit: 2010-05-30 | Discharge: 2010-05-30 | Payer: Self-pay | Source: Home / Self Care | Attending: Family Medicine | Admitting: Family Medicine

## 2010-05-30 ENCOUNTER — Encounter: Payer: Self-pay | Admitting: Family Medicine

## 2010-05-30 ENCOUNTER — Ambulatory Visit
Admission: RE | Admit: 2010-05-30 | Discharge: 2010-05-30 | Payer: Self-pay | Source: Home / Self Care | Attending: Obstetrics & Gynecology | Admitting: Obstetrics & Gynecology

## 2010-05-31 ENCOUNTER — Encounter: Payer: Self-pay | Admitting: Obstetrics & Gynecology

## 2010-05-31 ENCOUNTER — Inpatient Hospital Stay (HOSPITAL_COMMUNITY)
Admission: AD | Admit: 2010-05-31 | Discharge: 2010-06-02 | Payer: Self-pay | Source: Home / Self Care | Attending: Obstetrics & Gynecology | Admitting: Obstetrics & Gynecology

## 2010-05-31 ENCOUNTER — Ambulatory Visit (HOSPITAL_COMMUNITY)
Admission: RE | Admit: 2010-05-31 | Discharge: 2010-05-31 | Payer: Self-pay | Source: Home / Self Care | Attending: Family Medicine | Admitting: Family Medicine

## 2010-06-02 ENCOUNTER — Encounter: Payer: Self-pay | Admitting: Internal Medicine

## 2010-06-03 ENCOUNTER — Ambulatory Visit: Admit: 2010-06-03 | Payer: Self-pay | Admitting: Obstetrics and Gynecology

## 2010-06-03 ENCOUNTER — Ambulatory Visit (HOSPITAL_COMMUNITY): Admission: RE | Admit: 2010-06-03 | Payer: Self-pay | Source: Home / Self Care | Admitting: Family Medicine

## 2010-06-03 ENCOUNTER — Ambulatory Visit: Admit: 2010-06-03 | Payer: Self-pay | Admitting: Obstetrics & Gynecology

## 2010-06-03 LAB — COMPREHENSIVE METABOLIC PANEL
ALT: 25 U/L (ref 0–35)
AST: 24 U/L (ref 0–37)
CO2: 22 mEq/L (ref 19–32)
Chloride: 102 mEq/L (ref 96–112)
GFR calc Af Amer: >60 mL/min (ref >60)
GFR calc non Af Amer: >60 mL/min (ref >60)
Potassium: 4.6 mEq/L (ref 3.5–5.1)
Sodium: 135 mEq/L (ref 135–145)
Total Bilirubin: 0.1 mg/dL — ABNORMAL LOW (ref 0.3–1.2)

## 2010-06-03 LAB — CBC
Hemoglobin: 13.3 g/dL (ref 12.0–15.0)
RBC: 4.33 MIL/uL (ref 3.87–5.11)

## 2010-06-03 LAB — POCT URINALYSIS DIPSTICK
Bilirubin Urine: NEGATIVE
Hgb urine dipstick: NEGATIVE
Nitrite: NEGATIVE
Urine Glucose, Fasting: NEGATIVE mg/dL

## 2010-06-03 LAB — MRSA PCR SCREENING: MRSA by PCR: NEGATIVE

## 2010-06-03 LAB — RPR: RPR Ser Ql: NONREACTIVE

## 2010-06-03 LAB — URIC ACID: Uric Acid, Serum: 4.6 mg/dL (ref 2.4–7.0)

## 2010-06-03 NOTE — Op Note (Addendum)
NAMEKENNESHA, [name]              ACCOUNT NO.:  [id number]  MEDICAL RECORD NO.:  [id number]          PATIENT TYPE:  INP  LOCATION:  9372                          [facility]  PHYSICIAN:  Horton Chin, MD DATE OF BIRTH:  [birth year]  DATE OF PROCEDURE:  05/31/2010 DATE OF DISCHARGE:                              OPERATIVE REPORT   PREOPERATIVE DIAGNOSES: 1. Intrauterine pregnancy at 59 and 1/7 weeks' gestation. 2. Chronic hypertension with severe range blood pressures. 3. Reversed end-diastolic flow on umbilical artery Doppler. 4. Lagging growth. 5. Biophysical profile score of 6/10. 6. Breech presentation. 7. Morbid obesity. 8. Methadone use.  POSTOPERATIVE DIAGNOSES: 1. Intrauterine pregnancy at 28 and 1/7 weeks' gestation. 2. Chronic hypertension with severe range blood pressures. 3. Reversed end-diastolic flow on umbilical artery Doppler. 4. Lagging growth. 5. Biophysical profile score of 6/10. 6. Breech presentation. 7. Morbid obesity. 8. Methadone use.  PROCEDURE:  Primary low transverse cesarean section via Pfannenstiel incision.  SURGEON:  Horton Chin, MD  ASSISTANT:  Lucina Mellow, DO  ANESTHESIA:  Spinal.  ANESTHESIOLOGIST:  Belva Agee, MD  IV FLUIDS:  1400 mL of lactated Ringer's.  ESTIMATED BLOOD LOSS:  600 mL.  URINE OUTPUT:  800 mL.  INDICATIONS:  The patient is a 25 year old, gravida 1, para 0, at 34-1/7 weeks' gestation, (dated by LMP) who has been followed in our clinic and has had multiple admissions for chronic hypertension, lagging fetal growth, periods of nonreassuring fetal testing and also has a history of methadone use.  The patient has had multiple 24-hour urine's which have come back negative for protein, so she has not ruled in for preeclampsia, but she has had severe range blood pressures and required multiple agents to control her blood pressure.  The patient has been followed very closely on an outpatient  basis since her last admission and on an ultrasound that was done by the [facility] doctors today, the fetus was noted to have lagging growth and grew less than 200 g over the last 2 weeks.  A biophysical profile that was done was 6/10.  There was elevated umbilical artery Doppler with  periodic absent and reversed end- diastolic flow.  Given the nonreassuring  fetal status, the decision was made to move towards delivery. The fetus was breech in presentation and so cesarean section was recommended.  The patient was counseled regarding needing primary cesarean section and the risks of cesarean section were explained in detail including but not limited to: bleeding which might require transfusion, infection that might require antibiotics, injury to surrounding organs including bladder, bowel, ureters, need for additional procedures including hysterectomy in the event of a life-threatening bleed, thromboembolic phenomenon, postoperative incisional problems and all her questions were answered.  Written informed consent was obtained.  FINDINGS:  Viable female infant in breech presentation, Apgars were 8 and 9, arterial cord pH was 7.27. Weight 1425 g .Clear amniotic fluid. Small placenta intact with three-vessel cord.  Normal uterus and bilateral adnexa.  SPECIMENS:  Placenta which was sent to Pathology.  COMPLICATIONS:  None immediate.  PROCEDURE DETAILS:  The patient received preoperative Ancef  and had sequential compression devices applied to her lower extremities.  She was then taken to the operating room where general spinal analgesia was administered and found to be adequate.  The patient was then prepped and draped in a sterile manner, and a Foley catheter was inserted into the patient's bladder and attached to constant gravity.  After an adequate time-out was performed, attention was turned to the patient's abdomen, where a Pfannenstiel incision was made with a  scalpel and carried through to the underlying layer of fascia.  The fascia was incised bilaterally using Mayo scissors.  Kochers were applied to both aspects of the fascial incision, and underlying rectus muscles were dissected off bluntly and sharply.  The rectus muscles were separated in the midline bluntly and the peritoneum was entered sharply.  This incision was extended superiorly and inferiorly with good visualization of bowel and bladder.  Attention was then turned to the patient's uterus, where a bladder flap was created, and a low transverse hysterotomy was made with a scalpel and extended bilaterally using bandage scissors.  The amniotic fluid sac was ruptured for clear fluid.  The infant was encountered in breech presentation and delivered atraumatically.  Cord was clamped and cut, and the infant was handed over to the awaiting neonatology team. Cord blood samples were collected for cord pH and for blood type analysis according to protocol.  Fundal massage was administered and the placenta delivered intact.  The uterus was then cleared of all clot and debris using dry laparotomy sponges.  The hysterotomy was then repaired using 0 Monocryl in a running interlocking fashion.  A second layer of 0 Monocryl was used as an imbricating layer.  Overall, good hemostasis was noted.  The pelvis and gutters were cleared of all clot and debris.  The peritoneum and muscle were closed in a mass fashion using 2 interrupted stitches of 0 Monocryl.  The fascia was closed using 0 Vicryl in a running stitch.  The subcutaneous layer was irrigated and then was reapproximated using three interrupted sutures of 2-0 plain gut, and the skin was closed with staples.  The patient tolerated the  procedure well.  Sponge, instrument and  needle counts were correct x2.   She was taken to recovery room in stable condition.    Of note, the patient will be treated with magnesium sulfate for 24 hours postpartum  for eclampsia prophylaxis.     Horton Chin, MD     UAA/MEDQ  D:  05/31/2010  T:  06/01/2010  Job:  [id number]  Electronically Signed by Jaynie Collins MD on 06/03/2010 10:16:12 AM

## 2010-06-04 LAB — COMPREHENSIVE METABOLIC PANEL
ALT: 23 U/L (ref 0–35)
AST: 34 U/L (ref 0–37)
Albumin: 2.4 g/dL — ABNORMAL LOW (ref 3.5–5.2)
CO2: 28 mEq/L (ref 19–32)
Calcium: 7.7 mg/dL — ABNORMAL LOW (ref 8.4–10.5)
Chloride: 100 mEq/L (ref 96–112)
GFR calc Af Amer: >60 mL/min (ref >60)
GFR calc non Af Amer: >60 mL/min (ref >60)
Sodium: 133 mEq/L — ABNORMAL LOW (ref 135–145)

## 2010-06-04 LAB — CBC
Hemoglobin: 13.4 g/dL (ref 12.0–15.0)
MCHC: 34.4 g/dL (ref 30.0–36.0)
RBC: 4.39 MIL/uL (ref 3.87–5.11)

## 2010-06-05 ENCOUNTER — Inpatient Hospital Stay (HOSPITAL_COMMUNITY)
Admission: AD | Admit: 2010-06-05 | Discharge: 2010-06-05 | Payer: Self-pay | Source: Home / Self Care | Attending: Obstetrics & Gynecology | Admitting: Obstetrics & Gynecology

## 2010-06-05 LAB — COMPREHENSIVE METABOLIC PANEL
ALT: 23 U/L (ref 0–35)
AST: 23 U/L (ref 0–37)
Alkaline Phosphatase: 126 U/L — ABNORMAL HIGH (ref 39–117)
CO2: 29 mEq/L (ref 19–32)
Chloride: 102 mEq/L (ref 96–112)
GFR calc non Af Amer: >60 mL/min (ref >60)
Glucose, Bld: 104 mg/dL — ABNORMAL HIGH (ref 70–99)
Potassium: 4.2 mEq/L (ref 3.5–5.1)
Sodium: 138 mEq/L (ref 135–145)

## 2010-06-05 LAB — CBC
HCT: 36.7 % (ref 36.0–46.0)
MCH: 30.2 pg (ref 26.0–34.0)
MCHC: 33.5 g/dL (ref 30.0–36.0)
RDW: 13.8 % (ref 11.5–15.5)

## 2010-06-05 LAB — URINALYSIS, ROUTINE W REFLEX MICROSCOPIC
Bilirubin Urine: NEGATIVE
Ketones, ur: NEGATIVE mg/dL
Protein, ur: NEGATIVE mg/dL
Urobilinogen, UA: 0.2 mg/dL (ref 0.0–1.0)

## 2010-06-10 NOTE — Discharge Summary (Signed)
  [name], [name]              ACCOUNT NO.:  [id number]  MEDICAL RECORD NO.:  [id number]          PATIENT TYPE:  INP  LOCATION:  9306                          [facility]  PHYSICIAN:  Horton Chin, MD DATE OF BIRTH:  [birth year]  DATE OF ADMISSION:  05/31/2010 DATE OF DISCHARGE:  06/02/2010                              DISCHARGE SUMMARY   ADMITTING DIAGNOSES:  Pregnancy at 34 weeks and 1 day with chronic hypertension, narcotic addiction, fetal distress, lagging fetal growth and reverse end-diastolic flow on umbilical artery Doppler.  PROCEDURE:  Primary low-transverse cesarean section by Dr. Macon Large and Dr. Natale Milch, for breech presentation and reverse end- diastolic flow on Dopplers and lagging fetal growth.  FINDINGS:  A viable female infant with Apgars of 8 and 9 with arterial cord pH of 7.27.  PLACENTA:  To Pathology.  HOSPITAL COURSE:  The patient had some issues with her blood pressure, was placed on magnesium sulfate therapy, spent 24 hours in AICU, and responded well to the therapy.  At that point, magnesium sulfate was discontinued after 24 hours and the patient transferred out to the postpartum unit, on 5 Norvasc p.o. daily.  She is up ambulating well, taking p.o. fluids and solids well, passing gas rectally, no complaints.  DISCHARGE MEDICATIONS: 1. Norvasc 5 mg p.o. daily. 2. Percocet 5/325 one p.o. q.4 h. p.r.n. pain. 3. Motrin 600 p.o. q.6 h. p.r.n. cramping.  FOLLOWUP:  A [facility] nurse is to go out on postop day 5 through 7, remove her staples and do a blood pressure check and she is to follow up at the [facility] Department in 6 weeks if there is no further problems.  ACTIVITY LEVEL:  No heavy lifting or driving for at least 2 weeks.  DIET:  As tolerated.  PHYSICAL EXAMINATION:  VITAL SIGNS:  Today, vital signs stable. HEART:  Regular rhythm and rate. LUNGS:  Clear to auscultation bilaterally. ABDOMEN:  Soft and nontender.   Bowel sounds are present in all 4 quadrants.  Incision is intact.  There is no redness, swelling, or drainage.  No edema in the lower extremities.  Fundus is firm.  Lochia small amounts.  ASSESSMENT:  Stable postop day #2 with chronic hypertension and drug dependency.  PLAN:  We are going to discharge her home and the [facility] nurse is going to go out on postop day 5 through 7 for BP check and remove her staples.     [name], N.M.   ______________________________ Horton Chin, MD    DL/MEDQ  D:  16/02/9603  T:  06/02/2010  Job:  [id number]  cc:   [facility] Department  Electronically Signed by Wyvonnia Dusky N.M. on 06/09/2010 09:28:15 AM Electronically Signed by Jaynie Collins MD on 06/10/2010 06:09:43 PM

## 2010-06-12 ENCOUNTER — Encounter: Payer: Self-pay | Admitting: Family Medicine

## 2010-07-22 LAB — COMPREHENSIVE METABOLIC PANEL
ALT: 24 U/L (ref 0–35)
AST: 20 U/L (ref 0–37)
AST: 21 U/L (ref 0–37)
AST: 25 U/L (ref 0–37)
Albumin: 2.6 g/dL — ABNORMAL LOW (ref 3.5–5.2)
Albumin: 2.6 g/dL — ABNORMAL LOW (ref 3.5–5.2)
Albumin: 2.7 g/dL — ABNORMAL LOW (ref 3.5–5.2)
Alkaline Phosphatase: 128 U/L — ABNORMAL HIGH (ref 39–117)
Alkaline Phosphatase: 143 U/L — ABNORMAL HIGH (ref 39–117)
BUN: 3 mg/dL — ABNORMAL LOW (ref 6–23)
BUN: 5 mg/dL — ABNORMAL LOW (ref 6–23)
BUN: 5 mg/dL — ABNORMAL LOW (ref 6–23)
CO2: 27 mEq/L (ref 19–32)
Calcium: 8.7 mg/dL (ref 8.4–10.5)
Calcium: 9 mg/dL (ref 8.4–10.5)
Chloride: 103 mEq/L (ref 96–112)
Chloride: 106 mEq/L (ref 96–112)
Creatinine, Ser: 0.36 mg/dL — ABNORMAL LOW (ref 0.4–1.2)
Creatinine, Ser: 0.36 mg/dL — ABNORMAL LOW (ref 0.4–1.2)
Creatinine, Ser: 0.37 mg/dL — ABNORMAL LOW (ref 0.4–1.2)
GFR calc Af Amer: >60 mL/min (ref >60)
GFR calc Af Amer: >60 mL/min (ref >60)
GFR calc Af Amer: >60 mL/min (ref >60)
GFR calc non Af Amer: >60 mL/min (ref >60)
Glucose, Bld: 88 mg/dL (ref 70–99)
Potassium: 4 mEq/L (ref 3.5–5.1)
Potassium: 4.2 mEq/L (ref 3.5–5.1)
Sodium: 134 mEq/L — ABNORMAL LOW (ref 135–145)
Sodium: 136 mEq/L (ref 135–145)
Total Bilirubin: 0.1 mg/dL — ABNORMAL LOW (ref 0.3–1.2)
Total Bilirubin: 0.3 mg/dL (ref 0.3–1.2)
Total Protein: 5.6 g/dL — ABNORMAL LOW (ref 6.0–8.3)
Total Protein: 6.1 g/dL (ref 6.0–8.3)
Total Protein: 6.5 g/dL (ref 6.0–8.3)

## 2010-07-22 LAB — RAPID URINE DRUG SCREEN, HOSP PERFORMED
Benzodiazepines: NOT DETECTED
Cocaine: NOT DETECTED
Tetrahydrocannabinol: NOT DETECTED

## 2010-07-22 LAB — PROTEIN / CREATININE RATIO, URINE
Creatinine, Urine: 16.2 mg/dL
Total Protein, Urine: 7 mg/dL

## 2010-07-22 LAB — CBC
HCT: 34.8 % — ABNORMAL LOW (ref 36.0–46.0)
HCT: 35.8 % — ABNORMAL LOW (ref 36.0–46.0)
Hemoglobin: 12.1 g/dL (ref 12.0–15.0)
MCH: 29.9 pg (ref 26.0–34.0)
MCH: 30.4 pg (ref 26.0–34.0)
MCV: 89 fL (ref 78.0–100.0)
MCV: 89 fL (ref 78.0–100.0)
Platelets: 236 10*3/uL (ref 150–400)
Platelets: 246 10*3/uL (ref 150–400)
Platelets: 248 10*3/uL (ref 150–400)
RBC: 3.85 MIL/uL — ABNORMAL LOW (ref 3.87–5.11)
RBC: 3.91 MIL/uL (ref 3.87–5.11)
RBC: 4.02 MIL/uL (ref 3.87–5.11)
RDW: 13.9 % (ref 11.5–15.5)
RDW: 14.1 % (ref 11.5–15.5)
WBC: 10.9 10*3/uL — ABNORMAL HIGH (ref 4.0–10.5)
WBC: 14.4 10*3/uL — ABNORMAL HIGH (ref 4.0–10.5)
WBC: 17.4 10*3/uL — ABNORMAL HIGH (ref 4.0–10.5)

## 2010-07-22 LAB — PROTEIN, URINE, 24 HOUR
Collection Interval-UPROT: 24 hours
Collection Interval-UPROT: 24 hours

## 2010-07-22 LAB — CREATININE CLEARANCE, URINE, 24 HOUR
Collection Interval-CRCL: 24 hours
Creatinine Clearance: 259 mL/min — ABNORMAL HIGH (ref 75–115)
Creatinine, 24H Ur: 1260 mg/d (ref 700–1800)
Creatinine, 24H Ur: 1453 mg/d (ref 700–1800)
Creatinine, Urine: 45 mg/dL
Creatinine, Urine: 67.6 mg/dL
Creatinine: 0.36 mg/dL — ABNORMAL LOW (ref 0.4–1.2)
Urine Total Volume-CRCL: 2150 mL
Urine Total Volume-CRCL: 2800 mL

## 2010-07-22 LAB — POCT URINALYSIS DIPSTICK
Bilirubin Urine: NEGATIVE
Glucose, UA: NEGATIVE mg/dL
Glucose, UA: NEGATIVE mg/dL
Hgb urine dipstick: NEGATIVE
Ketones, ur: NEGATIVE mg/dL
Nitrite: NEGATIVE

## 2010-07-22 LAB — RPR: RPR Ser Ql: NONREACTIVE

## 2010-07-23 LAB — POCT URINALYSIS DIPSTICK
Bilirubin Urine: NEGATIVE
Hgb urine dipstick: NEGATIVE
Hgb urine dipstick: NEGATIVE
Ketones, ur: NEGATIVE mg/dL
Ketones, ur: NEGATIVE mg/dL
Protein, ur: NEGATIVE mg/dL
Protein, ur: NEGATIVE mg/dL
Specific Gravity, Urine: 1.015 (ref 1.005–1.030)
Specific Gravity, Urine: 1.015 (ref 1.005–1.030)
pH: 7.5 (ref 5.0–8.0)
pH: 6 (ref 5.0–8.0)

## 2010-07-26 LAB — URINALYSIS, ROUTINE W REFLEX MICROSCOPIC
Glucose, UA: NEGATIVE mg/dL
Hgb urine dipstick: NEGATIVE
Ketones, ur: NEGATIVE mg/dL
pH: 5.5 (ref 5.0–8.0)

## 2010-07-26 LAB — COMPREHENSIVE METABOLIC PANEL
AST: 25 U/L (ref 0–37)
CO2: 21 mEq/L (ref 19–32)
Calcium: 9 mg/dL (ref 8.4–10.5)
Creatinine, Ser: 0.45 mg/dL (ref 0.4–1.2)
GFR calc Af Amer: >60 mL/min (ref >60)
GFR calc non Af Amer: >60 mL/min (ref >60)
Glucose, Bld: 85 mg/dL (ref 70–99)

## 2010-07-26 LAB — GC/CHLAMYDIA PROBE AMP, GENITAL: GC Probe Amp, Genital: NEGATIVE

## 2010-07-26 LAB — WET PREP, GENITAL: Clue Cells Wet Prep HPF POC: NONE SEEN

## 2010-07-27 LAB — URINALYSIS, ROUTINE W REFLEX MICROSCOPIC
Leukocytes, UA: NEGATIVE
Nitrite: NEGATIVE
Specific Gravity, Urine: 1.026 (ref 1.005–1.030)
Urobilinogen, UA: 0.2 mg/dL (ref 0.0–1.0)

## 2010-07-27 LAB — PREGNANCY, URINE: Preg Test, Ur: POSITIVE

## 2010-07-27 LAB — URINE MICROSCOPIC-ADD ON

## 2010-07-31 ENCOUNTER — Other Ambulatory Visit: Payer: Self-pay | Admitting: Physician Assistant

## 2010-07-31 ENCOUNTER — Ambulatory Visit (INDEPENDENT_AMBULATORY_CARE_PROVIDER_SITE_OTHER): Payer: Self-pay | Admitting: Physician Assistant

## 2010-07-31 ENCOUNTER — Encounter: Payer: Self-pay | Admitting: Physician Assistant

## 2010-07-31 LAB — CONVERTED CEMR LAB
Platelets: 275 10*3/uL (ref 150–400)
RDW: 12.7 % (ref 11.5–15.5)

## 2010-08-01 ENCOUNTER — Ambulatory Visit (INDEPENDENT_AMBULATORY_CARE_PROVIDER_SITE_OTHER): Payer: Medicaid Other | Admitting: Obstetrics & Gynecology

## 2010-08-01 DIAGNOSIS — Z30430 Encounter for insertion of intrauterine contraceptive device: Secondary | ICD-10-CM

## 2010-08-01 DIAGNOSIS — Z3009 Encounter for other general counseling and advice on contraception: Secondary | ICD-10-CM

## 2010-08-02 NOTE — Progress Notes (Signed)
[name], [name]              ACCOUNT NO.:  [id number]  MEDICAL RECORD NO.:  [id number]           PATIENT TYPE:  A  LOCATION:  [facility]                   FACILITY:  [facility]  PHYSICIAN:  Maylon Cos, CNM    DATE OF BIRTH:  10-Aug-1985  DATE OF SERVICE:  07/31/2010                                 CLINIC NOTE  The patient is being seen at [facility] at [facility]  REASON FOR TODAY'S VISIT:  Postpartum exam.  HISTORY OF PRESENT ILLNESS:  [name] is a gravida 1, para 0-1-0-1, who presents 6 weeks after cesarean section secondary to breech presentation and preeclampsia that was done in 33 weeks and 4 days.  She delivered a female infant weighing 3 pounds and 2 ounces and was in the NICU for 3 weeks postdelivery.  Apgars at the time of delivery were 8 and 9. [name] presents today with no complaints.  However, her [place] postpartum screen shows that she has postpartum depression.  Her score is 19 and she is currently under the care of Dr. Mila Homer at [facility] for new diagnosis of bipolar disease and borderline personality disorder that she was recently started on Lamictal 100 mg daily and Neurontin 300 mg daily.  Additionally, she has also been started on Norvasc 10 mg 1 p.o. daily.  She has been on her psychiatric meds for approximately 24 hours and her Norvasc for approximately 1 week.  She denies any symptoms of headache, blurry vision, or spots. She states that she has been sexually active since delivery of her infant.  She used condoms and spermicide as protection.  She is interested in long-term contraception with Mirena IUD.  She states her vaginal bleeding after delivery stopped approximately 3 weeks ago.  PHYSICAL EXAMINATION:  GENERAL:  [name] is a pleasant 25 year old Caucasian female in no apparent distress. HEENT:  Positive for hirsutism. VITAL SIGNS;  Hypertension 153/99 and recheck was 155/104.  The patient's weight today is 243.9 which is 10  pounds up from delivery.  At delivery, she weighed 233. BREASTS:  Soft and nontender.  They are not lactating and no masses. ABDOMEN:  Obese.  Her Pfannenstiel incision is well healed without signs of infection and nontender to palpation.  Bimanual exam reveals nonenlarged, nontender uterus with nonenlarged, nontender adnexa. Perineum is shaved without lesions.  Cervix is nonfriable.  No cervical motion tenderness on examination. EXTREMITIES:  Reactive x4.  Edema 1+ in the lower extremities.  ASSESSMENT: 1. Six weeks postpartum status post a primary cesarean section, doing     well. 2. Borderline personality disorder. 3. Bipolar disorder. 4. Postpartum depression. 5. Hypertension. 6. Contraceptive counseling, undesired fertility.  PLAN: 1. The patient is encouraged to continue all of her medications for     bipolar and borderline personality disorder as directed by Dr. Mila Homer     and she should follow up with him in April as scheduled.  Signs and     symptoms of suicide ideations have been reviewed with the patient     and she is aware of her resources if she should have any thoughts     of harming herself or  others. 2. The patient should return as soon as possible for her Mirena IUD     placement and literature is given and reviewed today. 3. We will add 25 mg of hydrochlorothiazide and the patient has been     referred to [facility] for primary care and     hypertension management. 4. The patient is strongly encouraged to decrease her weight and     increase her exercise for health, hypertension reasons, overall     health and coping with her postpartum depression and stress     management.  Labs, we will check a CBC today to assess her     hemoglobin and she is encouraged to continue on multivitamin daily.     The patient should follow up as soon as possible for placement of     IUD.  She should follow up within 1 week for a blood pressure check     if not  sooner or p.r.n. problems.          ______________________________ Maylon Cos, CNM    SS/MEDQ  D:  07/31/2010  T:  08/01/2010  Job:  [id number]

## 2010-08-19 ENCOUNTER — Ambulatory Visit: Payer: Self-pay | Admitting: Family Medicine

## 2010-08-20 LAB — URINE MICROSCOPIC-ADD ON

## 2010-08-20 LAB — URINE CULTURE: Colony Count: 25000

## 2010-08-20 LAB — URINALYSIS, ROUTINE W REFLEX MICROSCOPIC
Ketones, ur: 15 mg/dL — AB
Nitrite: NEGATIVE
Urobilinogen, UA: 0.2 mg/dL (ref 0.0–1.0)

## 2010-08-29 ENCOUNTER — Ambulatory Visit: Payer: Self-pay | Admitting: Obstetrics and Gynecology
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
## 2011-01-24 ENCOUNTER — Emergency Department (INDEPENDENT_AMBULATORY_CARE_PROVIDER_SITE_OTHER): Payer: No Typology Code available for payment source

## 2011-01-24 ENCOUNTER — Encounter: Payer: Self-pay | Admitting: *Deleted

## 2011-01-24 ENCOUNTER — Emergency Department (HOSPITAL_BASED_OUTPATIENT_CLINIC_OR_DEPARTMENT_OTHER)
Admission: EM | Admit: 2011-01-24 | Discharge: 2011-01-24 | Disposition: A | Discharge status: Home / Self Care | Payer: Medicaid Other | Attending: Emergency Medicine | Admitting: Emergency Medicine

## 2011-01-24 DIAGNOSIS — M25519 Pain in unspecified shoulder: Secondary | ICD-10-CM

## 2011-01-24 DIAGNOSIS — IMO0002 Reserved for concepts with insufficient information to code with codable children: Secondary | ICD-10-CM | POA: Insufficient documentation

## 2011-01-24 DIAGNOSIS — Y92410 Unspecified street and highway as the place of occurrence of the external cause: Secondary | ICD-10-CM | POA: Insufficient documentation

## 2011-01-24 HISTORY — DX: Other psychoactive substance abuse, uncomplicated: F19.10

## 2011-01-24 HISTORY — DX: Essential (primary) hypertension: I10

## 2011-01-24 NOTE — ED Provider Notes (Signed)
Medical screening examination/treatment/procedure(s) were performed by non-physician practitioner and as supervising physician I was immediately available for consultation/collaboration.   Dayton Bailiff, MD 01/24/11 1714
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
## 2011-02-20 LAB — POCT PREGNANCY, URINE: Operator id: 10282

## 2011-02-24 LAB — URINALYSIS, ROUTINE W REFLEX MICROSCOPIC
Hgb urine dipstick: NEGATIVE
Ketones, ur: NEGATIVE
Protein, ur: NEGATIVE
Urobilinogen, UA: 0.2

## 2011-02-24 LAB — POCT PREGNANCY, URINE: Preg Test, Ur: NEGATIVE

## 2011-02-24 LAB — URINE MICROSCOPIC-ADD ON

## 2011-02-26 LAB — POCT URINALYSIS DIP (DEVICE)
Glucose, UA: NEGATIVE
Ketones, ur: NEGATIVE
Operator id: 247071
pH: 5.5

## 2011-02-26 LAB — I-STAT 8, (EC8 V) (CONVERTED LAB)
Bicarbonate: 24.8 — ABNORMAL HIGH
HCT: 54 — ABNORMAL HIGH
Hemoglobin: 18.4 — ABNORMAL HIGH
Operator id: 247071
Sodium: 141
TCO2: 26

## 2011-02-26 LAB — POCT PREGNANCY, URINE
Operator id: 247071
Preg Test, Ur: NEGATIVE

## 2012-01-04 ENCOUNTER — Emergency Department (HOSPITAL_BASED_OUTPATIENT_CLINIC_OR_DEPARTMENT_OTHER)
Admission: EM | Admit: 2012-01-04 | Discharge: 2012-01-04 | Disposition: A | Discharge status: Home / Self Care | Payer: Medicaid Other | Attending: Emergency Medicine | Admitting: Emergency Medicine

## 2012-01-04 ENCOUNTER — Encounter (HOSPITAL_BASED_OUTPATIENT_CLINIC_OR_DEPARTMENT_OTHER): Payer: Self-pay | Admitting: Emergency Medicine

## 2012-01-04 DIAGNOSIS — F19239 Other psychoactive substance dependence with withdrawal, unspecified: Secondary | ICD-10-CM | POA: Insufficient documentation

## 2012-01-04 DIAGNOSIS — F17200 Nicotine dependence, unspecified, uncomplicated: Secondary | ICD-10-CM | POA: Insufficient documentation

## 2012-01-04 DIAGNOSIS — F1920 Other psychoactive substance dependence, uncomplicated: Secondary | ICD-10-CM | POA: Insufficient documentation

## 2012-01-04 DIAGNOSIS — M4120 Other idiopathic scoliosis, site unspecified: Secondary | ICD-10-CM | POA: Insufficient documentation

## 2012-01-04 DIAGNOSIS — I10 Essential (primary) hypertension: Secondary | ICD-10-CM | POA: Insufficient documentation

## 2012-01-04 DIAGNOSIS — F1193 Opioid use, unspecified with withdrawal: Secondary | ICD-10-CM

## 2012-01-04 DIAGNOSIS — G8929 Other chronic pain: Secondary | ICD-10-CM | POA: Insufficient documentation

## 2012-01-04 DIAGNOSIS — F19939 Other psychoactive substance use, unspecified with withdrawal, unspecified: Secondary | ICD-10-CM | POA: Insufficient documentation

## 2012-01-04 DIAGNOSIS — F1123 Opioid dependence with withdrawal: Secondary | ICD-10-CM

## 2012-01-04 HISTORY — DX: Scoliosis, unspecified: M41.9

## 2012-01-04 HISTORY — DX: Dorsalgia, unspecified: M54.9

## 2012-01-04 HISTORY — DX: Other chronic pain: G89.29

## 2012-01-04 HISTORY — DX: Pain in unspecified knee: M25.569

## 2012-01-04 LAB — PREGNANCY, URINE: Preg Test, Ur: NEGATIVE

## 2012-01-04 MED ORDER — ONDANSETRON 8 MG PO TBDP: 8.0000 mg | ORAL_TABLET | Freq: Two times a day (BID) | ORAL | Status: AC | PRN

## 2012-01-04 MED ORDER — CLONIDINE HCL 0.1 MG PO TABS: 0.1000 mg | ORAL_TABLET | Freq: Two times a day (BID) | ORAL | Status: DC

## 2012-01-04 MED ORDER — ONDANSETRON 8 MG PO TBDP
8.0000 mg | ORAL_TABLET | Freq: Once | ORAL | Status: AC
  Administered 2012-01-04: 8 mg via ORAL
  Filled 2012-01-04: qty 1

## 2012-01-04 MED ORDER — LORAZEPAM 1 MG PO TABS: 1.0000 mg | ORAL_TABLET | Freq: Three times a day (TID) | ORAL | Status: AC | PRN

## 2012-01-04 MED ORDER — CLONIDINE HCL 0.1 MG PO TABS
0.2000 mg | ORAL_TABLET | Freq: Once | ORAL | Status: AC
  Administered 2012-01-04: 0.2 mg via ORAL
  Filled 2012-01-04: qty 2

## 2012-01-04 MED ORDER — LORAZEPAM 1 MG PO TABS
2.0000 mg | ORAL_TABLET | Freq: Once | ORAL | Status: AC
  Administered 2012-01-04: 2 mg via ORAL
  Filled 2012-01-04: qty 2

## 2012-01-04 NOTE — ED Provider Notes (Signed)
History     CSN: [id number]  Arrival date & time 01/04/12  1005   First MD Initiated Contact with Patient 01/04/12 1045      Chief Complaint  Patient presents with  . Withdrawal    (Consider location/radiation/quality/duration/timing/severity/associated sxs/prior treatment) HPI Comments: Pt with h/o chronic back pain and scoliosis and knee problems, got on street drugs, ie narcotics, years ago.  She got into methadone while pregnant to try to wean herslef, has a 47 month old at home now, she got off methadone but then started "dabbling" in street drugs again.  She has no money and wants to get into a treatment center.  She has an appt with [facility] tomorrow AM and plans to get on suboxone.  Ran out of narcotics 2 days ago, and now is having dry heaves, pain all over, anxious.  She doesn't desire inpt treatment, no SI, hallucinations.    The history is provided by the patient.    Past Medical History  Diagnosis Date  . Drug abuse   . Hypertension   . Scoliosis   . Chronic back pain   . Chronic knee pain     Past Surgical History  Procedure Date  . Knee surgery   . Cesarean section   . Ganglion cyst excision     History reviewed. No pertinent family history.  History  Substance Use Topics  . Smoking status: Current Everyday Smoker -- 1.0 packs/day  . Smokeless tobacco: Not on file  . Alcohol Use: No    OB History    Grav Para Term Preterm Abortions TAB SAB Ect Mult Living                  Review of Systems  Constitutional: Positive for chills and appetite change.  Gastrointestinal: Positive for nausea, vomiting and abdominal pain. Negative for diarrhea and constipation.  Musculoskeletal: Positive for myalgias, back pain and arthralgias.  Neurological: Negative for seizures, syncope, weakness, numbness and headaches.    Allergies  Review of patient's allergies indicates no known allergies.  Home Medications   Current Outpatient Rx  Name Route Sig  Dispense Refill  . LEVONORGESTREL 20 MCG/24HR IU IUD Intrauterine 1 each by Intrauterine route once.    Marland Kitchen CLONIDINE HCL 0.1 MG PO TABS Oral Take 1 tablet (0.1 mg total) by mouth 2 (two) times daily. 10 tablet 0  . IBUPROFEN 200 MG PO TABS Oral Take 600 mg by mouth every 6 (six) hours as needed. pain     . LORAZEPAM 1 MG PO TABS Oral Take 1 tablet (1 mg total) by mouth every 8 (eight) hours as needed for anxiety. 15 tablet 0  . ONDANSETRON 8 MG PO TBDP Oral Take 1 tablet (8 mg total) by mouth every 12 (twelve) hours as needed for nausea. 20 tablet 0    BP 164/93  Pulse 105  Temp 98 F (36.7 C) (Oral)  Resp 22  Ht 5\' 4"  (1.626 m)  Wt 250 lb (113.399 kg)  BMI 42.91 kg/m2  SpO2 100%  Physical Exam  Nursing note and vitals reviewed. Constitutional: She is oriented to person, place, and time. She appears well-developed and well-nourished.  HENT:  Head: Normocephalic and atraumatic.  Eyes: Pupils are equal, round, and reactive to light. No scleral icterus.  Neck: Normal range of motion. Neck supple.  Cardiovascular: Regular rhythm.  Tachycardia present.   No murmur heard. Pulmonary/Chest: Effort normal and breath sounds normal. No respiratory distress.  Abdominal: Soft. She exhibits  no distension. There is no tenderness.  Neurological: She is alert and oriented to person, place, and time.  Skin: Skin is warm.    ED Course  Procedures (including critical care time)   Labs Reviewed  PREGNANCY, URINE   No results found.   1. Narcotic withdrawal     RA sat is 100% and I interpret to be normal  MDM  Pt is likely in withdrawal from narcotics.  Will give zofran and clonidine.  Pt understands limitations of Benzo's and she knows that this is also addictive.  PLan is to only give enough to last until tomorrow where she can get on suboxone.  I also suggested continuing current meds instead.  Will give other resources as well.  HTN and mild tachycardia is liekly due to withdrawal.           Gavin Pound. Kyrie Fludd, MD 01/04/12 1104

## 2012-01-04 NOTE — Discharge Instructions (Signed)
Narcotic Withdrawal  When drug use interferes with normal living activities and relationships, it is abuse. Abuse includes problems with family and friends. Psychological dependence has developed when your mind tells you that the drug is needed. This is usually followed by a physical dependence in which you need more of the drug to get the same feeling or "high." This is known as addiction or chemical dependency. Risk is greater when chemical dependency exists in the family.  SYMPTOMS   When tolerance to narcotics has developed, stopping of the narcotic suddenly can cause uncomfortable physical symptoms. Most of the time these are mild and consist of shakes or jitters (tremors) in the hands,a rapid heart rate, rapid breathing, and temperature. Sometimes these symptoms are associated with anxiety, panic attacks, and bad dreams. Other symptoms include:   Irritability.   Anxiety.   Runny nose.   "Goose flesh."   Diarrhea.   Feeling sick to the stomach (nauseous).   Muscle spasms.   Sleeplessness.   Chills.   Sweats.   Drug cravings.   Confusion.  The severity of the withdrawal is based on the individual and varies from person to person. Many people choose to continue using narcotics to get rid of the discomfort of withdrawal. They also use to try to feel normal.  TREATMENT   Quitting an addiction means stopping use of all chemicals. This is hard but may save your life. With continual drug use, possible outcomes are often loss of self respect and esteem, violence, death, and eventually prison if the use of narcotics has led to the death of another.  Addiction cannot be cured, but it can be stopped. This often requires outside help and the care of professionals. Most hospitals and clinics can refer you to a specialized care center.  It is not necessary for you to go through the uncomfortable symptoms of withdrawal. Your caregiver can provide you with medications that will help you through this difficult  period. Try to avoid situations, friends, or alcohol, which may have made it possible for you to continue using narcotics in the past. Learn how to say no!  HOME CARE INSTRUCTIONS    Drink fluids, get plenty of rest, and take hot baths.   Medicines may be prescribed to help control withdrawal symptoms.   Over-the-counter medicines may be helpful to control diarrhea or an upset stomach.   If your problems resulted from taking prescription pain medicines, make sure you have a follow-up visit with your caregiver within the next few days. Be open about this problem.   If you are dependent or addicted to street drugs, contact a local drug and alcohol treatment center or Narcotics Anonymous.   Have someone with you to monitor your symptoms.   Engage in healthy activities with friends who do not use drugs.   Stay away from the drug scene.  It takes a long period of time to overcome addictions to all drugs. There may be times when you feel as though you want to use. Following loss of a physical addiction and going through withdrawal, you have conquered the most difficult part of getting rid of an addiction. Gradually, you will have a lessening of the craving that is telling you that you need narcotics to feel normal. Call your caregiver or a member of your support group if more support is needed. Learn who to talk to in your family and among your friends so that during these periods you can receive outside help.  SEEK IMMEDIATE   controlled, especially if you cannot keep liquids down.   You are seeing things or hearing voices that are not really there (hallucinating).   You have a seizure.  Document Released: 07/19/2002 Document Revised: 04/17/2011 Document Reviewed: 04/23/2008 [facility] Patient Information 2012 [place], [place].  RESOURCE GUIDE  Chronic Pain Problems: Contact [facility]  [phone] Patients need to be  referred by their primary care doctor.  Insufficient Money for Medicine: Contact United Way:  call "211" or [facility] [phone].  No Primary Care Doctor: - Call [facility]  [phone] - can help you locate a primary care doctor that  accepts your insurance, provides certain services, etc. - Physician Referral Service- [phone]  Agencies that provide inexpensive medical care: - [facility]  [phone] - [facility]  [phone] - [facility]  [phone] - Women's Clinic  [phone] - [facility]  [phone] [facility]  [phone]  Medicaid-accepting [facility] Providers: - [facility]- [address]  [phone], Mon-Fri 9am-7pm, Sat 9am-1pm - [facility]- [address] [place] [address] [place], [address] [place]  [phone] - [facility]- [address] [place]  [phone] [facility]- [address]  [phone] - Renaye Rakers- [address] [place], [address], [phone]  Only accepts [place] patients after they have their name  applied to their card  Self Pay (no insurance) in [place]: - Sickle Cell Patients: Dr Willey Blade, [facility]  [address] [place], [phone] - [facility]- [address] [place]  [phone]       [facility] [place]- [address]       -     [facility]- see information above (Speak to [employer] if you do not have insurance)       -  [facility]- [address] [place], [phone]       -  [facility]- [address],  [phone]       -  [facility]- [address], [phone]       -  Dr Julio Sicks-  [address], [place], [phone]       -  [facility]- [address] [phone]       -  [facility]- [address] [phone], also [address] [phone]       -    [facility]- [address] [place], [phone], 1st & 3rd Saturday   every month, 10am-1pm  1) Find a Doctor and Pay Out of Pocket Although you won't have to find out who is covered by your insurance plan, it is a good idea to ask around and get recommendations. You will then need to call the office and see if the doctor you have chosen will accept you as a new patient and what types of options they offer for patients who are self-pay. Some doctors offer discounts or will set up payment plans for their patients who do not have insurance, but you will need to ask so you aren't surprised when you get to your appointment.  2) Contact Your Local Health Department Not all health departments have doctors that can see patients for sick visits, but many do, so  it is worth a call to see if yours does. If you don't know where your local health department is, you can check in your phone book. The CDC also has a tool to help you locate your state's health department, and many state websites also have listings of all of their local health departments.  3) Find a Walk-in Clinic If your illness is not likely to be very severe or complicated, you may want to try a walk in clinic. These are popping up all over the country in pharmacies, drugstores, and shopping centers. They're usually staffed by nurse practitioners or physician assistants that have been trained to treat common illnesses and complaints. They're usually fairly quick and inexpensive. However, if you have serious medical issues or chronic medical problems, these are probably not your best option  STD Testing - [facility] Department of [facility], STD Clinic, [address] [place], phone [phone] or [phone].  Monday - Friday, call for an appointment. [facility] Department of [employer], STD Clinic, [address], [place], phone [phone] or [phone].   Monday - Friday, call for an appointment.  Abuse/Neglect: [facility] Child Abuse Hotline 817-507-1259 [facility] Child Abuse Hotline (434) 222-8068 (After Hours)  Emergency Shelter:  [facility] (620)074-6868  Maternity Homes: - [facility] at [facility] [place] [facility] [phone] - [facility] [phone]  MRSA Hotline #:   484-578-2324  [facility] Resources  [facility] [place]  United Way [facility] Dept. [address]                 [address]         [address] [place] [address]  Blondell Reveal Phone:  [phone]                                  Phone:  [phone]                   Phone:  [phone]  [facility] Mental Health, [phone] - [facility] - [phone]       -     [facility] in [place], [address],                                  [phone], [facility] Child Abuse Hotline 579 339 8208 or 587-339-2566 (After Hours)   Behavioral Health Services  Substance Abuse Resources: - [facility] Drug [facility]  [phone] - [facility] [phone] - [facility] [place] [phone] Floydene Flock [phone] - Residential & Outpatient Substance Abuse Program  [phone]  Psychological Services: [facility]  ([phone] Services  9316124538 - [facility], [phone] [place]. [address] [place], ACCESS LINE: (989)630-5157 or 253-626-1933, [employer]  Dental Assistance  If unable to pay or uninsured, contact:  [facility] or [facility]. to become qualified for the adult dental clinic.  Patients with Medicaid: [facility] [phone] [address], [phone] [address]  [phone]  If unable to pay, or uninsured, contact [facility] [phone]) or [facility] Department [phone] in [place], [phone] in [facility]) to become qualified for the adult dental clinic  Other Low-Cost Community Dental Services: - [facility]- [address] [place], [place], [place], [address], [phone], [address], 2nd and 4th Thursday of the month at 6:30am.  10 clients each day by appointment, can sometimes see walk-in patients if someone does not show for an appointment. [facility]- [address] [place], [place], [address], [phone] - [facility]- [address], [place], [place], [address], [phone] - [facility]- [phone] [facility] Health Department- [phone] [facility] Department- [phone]

## 2012-01-04 NOTE — ED Notes (Signed)
Pt was weaning off of methadone, last dose [id number].  Pt states she has taken oxycontin three days ago.  Pt believes she is having withdrawal symptoms.  Pt c/o back and leg pain.  Some N/V.  Pt also having some anxiety.  Noted fine tremors in hands.

## 2012-08-18 ENCOUNTER — Emergency Department (HOSPITAL_BASED_OUTPATIENT_CLINIC_OR_DEPARTMENT_OTHER)
Admission: EM | Admit: 2012-08-18 | Discharge: 2012-08-18 | Discharge status: Against Medical Advice | Payer: No Typology Code available for payment source | Attending: Emergency Medicine | Admitting: Emergency Medicine

## 2012-08-18 ENCOUNTER — Encounter (HOSPITAL_BASED_OUTPATIENT_CLINIC_OR_DEPARTMENT_OTHER): Payer: Self-pay

## 2012-08-18 DIAGNOSIS — Z79899 Other long term (current) drug therapy: Secondary | ICD-10-CM | POA: Insufficient documentation

## 2012-08-18 DIAGNOSIS — I10 Essential (primary) hypertension: Secondary | ICD-10-CM | POA: Insufficient documentation

## 2012-08-18 DIAGNOSIS — IMO0002 Reserved for concepts with insufficient information to code with codable children: Secondary | ICD-10-CM | POA: Insufficient documentation

## 2012-08-18 DIAGNOSIS — G8929 Other chronic pain: Secondary | ICD-10-CM | POA: Insufficient documentation

## 2012-08-18 DIAGNOSIS — Y939 Activity, unspecified: Secondary | ICD-10-CM | POA: Insufficient documentation

## 2012-08-18 DIAGNOSIS — F17200 Nicotine dependence, unspecified, uncomplicated: Secondary | ICD-10-CM | POA: Insufficient documentation

## 2012-08-18 DIAGNOSIS — Z5321 Procedure and treatment not carried out due to patient leaving prior to being seen by health care provider: Secondary | ICD-10-CM

## 2012-08-18 DIAGNOSIS — Z8739 Personal history of other diseases of the musculoskeletal system and connective tissue: Secondary | ICD-10-CM | POA: Insufficient documentation

## 2012-08-18 DIAGNOSIS — Y92410 Unspecified street and highway as the place of occurrence of the external cause: Secondary | ICD-10-CM | POA: Insufficient documentation

## 2012-08-18 NOTE — ED Provider Notes (Signed)
History     CSN: [id number]  Arrival date & time 08/18/12  1034   First MD Initiated Contact with Patient 08/18/12 1149      Chief Complaint  Patient presents with  . Optician, dispensing  . Neck Pain  . Back Pain    (Consider location/radiation/quality/duration/timing/severity/associated sxs/prior treatment) HPI  Past Medical History  Diagnosis Date  . Drug abuse   . Hypertension   . Scoliosis   . Chronic back pain   . Chronic knee pain     Past Surgical History  Procedure Laterality Date  . Knee surgery    . Cesarean section    . Ganglion cyst excision      No family history on file.  History  Substance Use Topics  . Smoking status: Current Every Day Smoker -- 1.00 packs/day  . Smokeless tobacco: Not on file  . Alcohol Use: No    OB History   Grav Para Term Preterm Abortions TAB SAB Ect Mult Living                  Review of Systems  Allergies  Review of patient's allergies indicates no known allergies.  Home Medications   Current Outpatient Rx  Name  Route  Sig  Dispense  Refill  . methadone (DOLOPHINE) 0.4 mg/mL SOLN   Oral   Take by mouth.         Marland Kitchen ibuprofen (ADVIL,MOTRIN) 200 MG tablet   Oral   Take 600 mg by mouth every 6 (six) hours as needed. pain          . levonorgestrel (MIRENA) 20 MCG/24HR IUD   Intrauterine   1 each by Intrauterine route once.           BP 176/109  Pulse 88  Temp(Src) 98.2 F (36.8 C) (Oral)  Resp 18  Ht 5\' 4"  (1.626 m)  Wt 250 lb (113.399 kg)  BMI 42.89 kg/m2  SpO2 99%  Physical Exam  ED Course  Procedures (including critical care time)  Labs Reviewed - No data to display No results found.   1. Patient left without being seen         Carleene Cooper III, MD 08/18/12 1157

## 2012-08-18 NOTE — ED Notes (Signed)
Called into pt's room-pt redressed and states she had 2 people cancel to pick up her child from school at 1215 and she must leave-advised EDP had signed up for her and should be in within minutes-states she can not wait and will return later today if able-pt signed out AMA-left with quick steady pace-NAD
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
## 2012-08-18 NOTE — ED Notes (Signed)
Pt also reports mid back pain described as "sore".

## 2014-10-31 ENCOUNTER — Encounter (HOSPITAL_BASED_OUTPATIENT_CLINIC_OR_DEPARTMENT_OTHER): Payer: Self-pay | Admitting: Emergency Medicine

## 2014-10-31 ENCOUNTER — Emergency Department (HOSPITAL_BASED_OUTPATIENT_CLINIC_OR_DEPARTMENT_OTHER)
Admission: EM | Admit: 2014-10-31 | Discharge: 2014-10-31 | Disposition: A | Discharge status: Home / Self Care | Payer: Medicaid Other | Attending: Emergency Medicine | Admitting: Emergency Medicine

## 2014-10-31 ENCOUNTER — Other Ambulatory Visit: Payer: Self-pay

## 2014-10-31 DIAGNOSIS — I10 Essential (primary) hypertension: Secondary | ICD-10-CM | POA: Insufficient documentation

## 2014-10-31 DIAGNOSIS — G8929 Other chronic pain: Secondary | ICD-10-CM | POA: Insufficient documentation

## 2014-10-31 DIAGNOSIS — Z720 Tobacco use: Secondary | ICD-10-CM | POA: Insufficient documentation

## 2014-10-31 DIAGNOSIS — Z79899 Other long term (current) drug therapy: Secondary | ICD-10-CM | POA: Insufficient documentation

## 2014-10-31 DIAGNOSIS — M419 Scoliosis, unspecified: Secondary | ICD-10-CM | POA: Diagnosis not present

## 2014-10-31 HISTORY — DX: Opioid dependence, uncomplicated: F11.20

## 2014-10-31 LAB — BASIC METABOLIC PANEL
Anion gap: 11 (ref 5–15)
BUN: 9 mg/dL (ref 6–20)
CALCIUM: 9.1 mg/dL (ref 8.9–10.3)
CO2: 31 mmol/L (ref 22–32)
Chloride: 98 mmol/L — ABNORMAL LOW (ref 101–111)
Creatinine, Ser: 0.59 mg/dL (ref 0.44–1.00)
GFR calc Af Amer: >60 mL/min (ref >60)
GLUCOSE: 106 mg/dL — AB (ref 65–99)
Potassium: 3.1 mmol/L — ABNORMAL LOW (ref 3.5–5.1)
SODIUM: 140 mmol/L (ref 135–145)

## 2014-10-31 LAB — URINALYSIS, ROUTINE W REFLEX MICROSCOPIC
Bilirubin Urine: NEGATIVE
GLUCOSE, UA: NEGATIVE mg/dL
HGB URINE DIPSTICK: NEGATIVE
KETONES UR: NEGATIVE mg/dL
LEUKOCYTES UA: NEGATIVE
NITRITE: NEGATIVE
PH: 6.5 (ref 5.0–8.0)
PROTEIN: NEGATIVE mg/dL
Specific Gravity, Urine: 1.021 (ref 1.005–1.030)
Urobilinogen, UA: 2 mg/dL — ABNORMAL HIGH (ref 0.0–1.0)

## 2014-10-31 LAB — TROPONIN I

## 2014-10-31 MED ORDER — LISINOPRIL 20 MG PO TABS: 20.0000 mg | ORAL_TABLET | Freq: Every day | ORAL | Status: DC

## 2014-10-31 NOTE — ED Notes (Signed)
Pt denies any symptoms, but reports that she went to new PCP on Friday who recommended that she go to an urgent care since her BP was 160/118 per pt. Also had "abnormal T waves" on EKG. Pt denies SOB, CP, dizziness, no new LE swelling.

## 2014-10-31 NOTE — ED Provider Notes (Signed)
CSN: [id number]     Arrival date & time 10/31/14  0707 History   First MD Initiated Contact with Patient 10/31/14 [phone]     Chief Complaint  Patient presents with  . Hypertension     (Consider location/radiation/quality/duration/timing/severity/associated sxs/prior Treatment) HPI  Pt presenting with c/o high blood pressure.  She has no symptoms, she states she feels fine.  She was seen by a new doctor 4 days ago and was found to be hypertensive.  She was advised to go to urgent care that day but was unable to get a ride, so today she presents for evaluation.  No chest pain, no shortness of breath, no leg swelling.  No changes in vision or speech, no focal weakness or headache.  She states she has been taking lisinopril.  There are no other associated systemic symptoms, there are no other alleviating or modifying factors.   Past Medical History  Diagnosis Date  . Drug abuse   . Hypertension   . Scoliosis   . Chronic back pain   . Chronic knee pain   . Long-term current use of methadone for opiate dependence    Past Surgical History  Procedure Laterality Date  . Knee surgery    . Cesarean section    . Ganglion cyst excision     History reviewed. No pertinent family history. History  Substance Use Topics  . Smoking status: Current Every Day Smoker -- 1.00 packs/day  . Smokeless tobacco: Not on file  . Alcohol Use: No   OB History    No data available     Review of Systems  ROS reviewed and all otherwise negative except for mentioned in HPI    Allergies  Review of patient's allergies indicates no known allergies.  Home Medications   Prior to Admission medications   Medication Sig Start Date End Date Taking? Authorizing Provider  ibuprofen (ADVIL,MOTRIN) 200 MG tablet Take 600 mg by mouth every 6 (six) hours as needed. pain     Historical Provider, MD  levonorgestrel (MIRENA) 20 MCG/24HR IUD 1 each by Intrauterine route once.    Historical Provider, MD  lisinopril  (PRINIVIL,ZESTRIL) 20 MG tablet Take 1 tablet (20 mg total) by mouth daily. 10/31/14   Jerelyn Scott, MD  methadone (DOLOPHINE) 0.4 mg/mL SOLN Take by mouth.    Historical Provider, MD   BP 155/91 mmHg  Pulse 69  Temp(Src) 98.1 F (36.7 C) (Oral)  Resp 18  Ht 5\' 3"  (1.6 m)  Wt 250 lb (113.399 kg)  BMI 44.30 kg/m2  SpO2 96%  Vitals reviewed Physical Exam  Physical Examination: General appearance - alert, well appearing, and in no distress Mental status - alert, oriented to person, place, and time Eyes - pupils equal and reactive, no conjunctival injection, no scleral icterus Mouth - mucous membranes moist, pharynx normal without lesions Chest - clear to auscultation, no wheezes, rales or rhonchi, symmetric air entry Heart - normal rate, regular rhythm, normal S1, S2, no murmurs, rubs, clicks or gallops Abdomen - soft, nontender, nondistended, no masses or organomegaly Neurological - alert, oriented, normal speech, cranial nerves grossly intact, strength 5/5 in extremities x 4, sensation intact Extremities - peripheral pulses normal, no pedal edema, no clubbing or cyanosis Skin - normal coloration and turgor, no rashes  ED Course  Procedures (including critical care time) Labs Review Labs Reviewed  URINALYSIS, ROUTINE W REFLEX MICROSCOPIC (NOT AT [facility]) - Abnormal; Notable for the following:    Urobilinogen, UA 2.0 (*)  All other components within normal limits  BASIC METABOLIC PANEL - Abnormal; Notable for the following:    Potassium 3.1 (*)    Chloride 98 (*)    Glucose, Bld 106 (*)    All other components within normal limits  TROPONIN I    Imaging Review No results found.   EKG Interpretation   Date/Time:  Tuesday October 31 2014 07:24:05 EDT Ventricular Rate:  86 PR Interval:  166 QRS Duration: 108 QT Interval:  408 QTC Calculation: 488 R Axis:   64 Text Interpretation:  Normal sinus rhythm with sinus arrhythmia  Nonspecific T wave abnormality borderline  prolonged QT Abnormal ECG No old  tracing to compare Confirmed by [facility]  MD, MARTHA [phone]) on 10/31/2014  8:26:36 AM      MDM   Final diagnoses:  Essential hypertension    Pt presenting with concern for hypertension.  She has no symptoms.  She is taking lisinopril.  She has some nonspecific ekg changes on EKG- unknown whether these are new or old, no prior for comparison.  Troponin is negative.  No signs of end organ damage on workup or exam.  Discussed hypertension with patient and results.  She will arrange for close followup with her PMD to get on a BP regimen that will keep her HTN under good control.  Discharged with strict return precautions.  Pt agreeable with plan.    Jerelyn Scott, MD 10/31/14 [phone]

## 2014-10-31 NOTE — Discharge Instructions (Signed)
Return to the ED with any concerns including chest pain, difficulty breathing, changes in vision or speech, weakness in arms or legs, fainting, decreased level of alertness/lethargy, or any other alarming symptoms

## 2015-08-17 ENCOUNTER — Ambulatory Visit (INDEPENDENT_AMBULATORY_CARE_PROVIDER_SITE_OTHER): Payer: Medicaid Other | Admitting: Obstetrics and Gynecology

## 2015-08-17 ENCOUNTER — Encounter: Payer: Self-pay | Admitting: Obstetrics and Gynecology

## 2015-08-17 VITALS — BP 158/89 | HR 88 | Temp 98.3°F | Ht 63.0 in | Wt 262.8 lb

## 2015-08-17 DIAGNOSIS — Z30432 Encounter for removal of intrauterine contraceptive device: Secondary | ICD-10-CM

## 2015-08-17 NOTE — Progress Notes (Signed)
Patient ID: [name], female   DOB: 03-01-1986, 30 y.o.   MRN: [id number] 30 yo G1P0101 presenting today for IUD removal. Patient desires to have it removed as she plans on conceiving in the near future.   Past Medical History  Diagnosis Date  . Drug abuse   . Hypertension   . Scoliosis   . Chronic back pain   . Chronic knee pain   . Long-term current use of methadone for opiate dependence [facility](H[facility])    Past Surgical History  Procedure Laterality Date  . Knee surgery    . Cesarean section    . Ganglion cyst excision     History reviewed. No pertinent family history. Social History  Substance Use Topics  . Smoking status: Current Every Day Smoker -- 0.75 packs/day    Types: Cigarettes  . Smokeless tobacco: Never Used  . Alcohol Use: No   ROS See pertinent in HPI  Blood pressure 158/89, pulse 88, temperature 98.3 F (36.8 C), height 5\' 3"  (1.6 m), weight 262 lb 12.8 oz (119.205 kg). GENERAL: Well-developed, well-nourished female in no acute distress.  ABDOMEN: Soft, nontender, nondistended. Obese PELVIC: Normal external female genitalia. Vagina is pink and rugated.  Normal discharge. Normal appearing cervix with IUD strings visible at the os. Uterus is normal in size. No adnexal mass or tenderness. EXTREMITIES: No cyanosis, clubbing, or edema, 2+ distal pulses.  A/P 30 yo here for IUD removal - After informed consent was obtained, a sterile speculum was placed in the vagina, allowing for visualization of the cervix. The IUD strings were grasped with a ring Forceps and the IUD was removed without difficulty.  - Patient with HTN, advised patient to follow up with PCP for a change in medication and to plan to conceive when BP are normal - Patient advised to start taking PNV - RTC prn

## 2015-08-17 NOTE — Progress Notes (Signed)
Here for IUD removal. Does not plan to use any other birth control- considering pregnancy. BP elevated. States forgot bp med yesterday, just took dose on arrival today.

## 2017-03-17 ENCOUNTER — Ambulatory Visit (INDEPENDENT_AMBULATORY_CARE_PROVIDER_SITE_OTHER): Payer: Medicaid Other | Admitting: Obstetrics and Gynecology

## 2017-03-17 ENCOUNTER — Encounter: Payer: Self-pay | Admitting: Obstetrics and Gynecology

## 2017-03-17 ENCOUNTER — Other Ambulatory Visit (HOSPITAL_COMMUNITY)
Admission: RE | Admit: 2017-03-17 | Discharge: 2017-03-17 | Disposition: A | Discharge status: Home / Self Care | Payer: Medicaid Other | Source: Ambulatory Visit | Attending: Obstetrics and Gynecology | Admitting: Obstetrics and Gynecology

## 2017-03-17 VITALS — BP 190/97 | HR 88 | Wt 260.0 lb

## 2017-03-17 DIAGNOSIS — Z0000 Encounter for general adult medical examination without abnormal findings: Secondary | ICD-10-CM

## 2017-03-17 DIAGNOSIS — Z113 Encounter for screening for infections with a predominantly sexual mode of transmission: Secondary | ICD-10-CM

## 2017-03-17 DIAGNOSIS — Z1389 Encounter for screening for other disorder: Secondary | ICD-10-CM

## 2017-03-17 DIAGNOSIS — Z01419 Encounter for gynecological examination (general) (routine) without abnormal findings: Secondary | ICD-10-CM | POA: Diagnosis present

## 2017-03-17 DIAGNOSIS — R102 Pelvic and perineal pain: Secondary | ICD-10-CM

## 2017-03-17 NOTE — Patient Instructions (Signed)
Diet for Polycystic Ovarian Syndrome Polycystic ovary syndrome (PCOS) is a disorder of the chemical messengers (hormones) that regulate menstruation. The condition causes important hormones to be out of balance. PCOS can:  Make your periods irregular or stop.  Cause cysts to develop on the ovaries.  Make it difficult to get pregnant.  Stop your body from responding to the effects of insulin (insulin resistance), which can lead to obesity and diabetes.  Changing what you eat can help manage PCOS and improve your health. It can help you lose weight and improve the way your body uses insulin. What is my plan?  Eat breakfast, lunch, and dinner plus two snacks every day.  Include protein in each meal and snack.  Choose whole grains instead of products made with refined flour.  Eat a variety of foods.  Exercise regularly as told by your health care provider. What do I need to know about this eating plan? If you are overweight or obese, pay attention to how many calories you eat. Cutting down on calories can help you lose weight. Work with your health care provider or dietitian to figure out how many calories you need each day. What foods can I eat? Grains Whole grains, such as whole wheat. Whole-grain breads, crackers, cereals, and pasta. Unsweetened oatmeal, bulgur, barley, quinoa, or brown rice. Corn or whole-wheat flour tortillas. Vegetables  Lettuce. Spinach. Peas. Beets. Cauliflower. Cabbage. Broccoli. Carrots. Tomatoes. Squash. Eggplant. Herbs. Peppers. Onions. Cucumbers. Brussels sprouts. Fruits Berries. Bananas. Apples. Oranges. Grapes. Papaya. Mango. Pomegranate. Kiwi. Grapefruit. Cherries. Meats and Other Protein Sources Lean proteins, such as fish, chicken, beans, eggs, and tofu. Dairy Low-fat dairy products, such as skim milk, cheese sticks, and yogurt. Beverages Low-fat or fat-free drinks, such as water, low-fat milk, sugar-free drinks, and 100% fruit  juice. Condiments Ketchup. Mustard. Barbecue sauce. Relish. Low-fat or fat-free mayonnaise. Fats and Oils Olive oil or canola oil. Walnuts and almonds. The items listed above may not be a complete list of recommended foods or beverages. Contact your dietitian for more options. What foods are not recommended? Foods high in calories or fat. Fried foods. Sweets. Products made from refined white flour, including white bread, pastries, white rice, and pasta. The items listed above may not be a complete list of foods and beverages to avoid. Contact your dietitian for more information. This information is not intended to replace advice given to you by your health care provider. Make sure you discuss any questions you have with your health care provider. Document Released: 08/20/2015 Document Revised: 10/04/2015 Document Reviewed: 05/10/2014 Elsevier Interactive Patient Education  2018 [employer]  

## 2017-03-17 NOTE — Progress Notes (Signed)
Subjective:     [name] is a 31 y.o. female G1P0101 with BMI 46 who is here for a comprehensive physical exam. The patient reports some abdominal pain with intercourse. She reports the same pain occurs randomly outside of intercourse. The pain is located in her LLQ. She describes it as a non radiating sharp pain. She denies any abnormal discharge. She reports an irregular mentrual cycle with menses occuring every 4 months lasting 5-7 days. She is actively trying to conceive  Past Medical History:  Diagnosis Date  . Chronic back pain   . Chronic knee pain   . Drug abuse (HCC)   . Hypertension   . Long-term current use of methadone for opiate dependence (HCC)   . Scoliosis    Past Surgical History:  Procedure Laterality Date  . CESAREAN SECTION    . GANGLION CYST EXCISION    . KNEE SURGERY     No family history on file.   Social History   Socioeconomic History  . Marital status: Single    Spouse name: Not on file  . Number of children: Not on file  . Years of education: Not on file  . Highest education level: Not on file  Social Needs  . Financial resource strain: Not on file  . Food insecurity - worry: Not on file  . Food insecurity - inability: Not on file  . Transportation needs - medical: Not on file  . Transportation needs - non-medical: Not on file  Occupational History  . Not on file  Tobacco Use  . Smoking status: Current Every Day Smoker    Packs/day: 0.75    Types: Cigarettes  . Smokeless tobacco: Never Used  Substance and Sexual Activity  . Alcohol use: No  . Drug use: Yes    Comment: oxycontin, opana, neurontin stopped 2011, on methadone now  . Sexual activity: Yes    Birth control/protection: None  Other Topics Concern  . Not on file  Social History Narrative  . Not on file   Health Maintenance  Topic Date Due  . TETANUS/TDAP  07/05/2003  . PAP SMEAR  07/30/2013  . INFLUENZA VACCINE  12/10/2016  . HIV Screening  Completed        Review of Systems Pertinent items are noted in HPI.   Objective:  Blood pressure (!) 190/97, pulse 88, weight 260 lb (117.9 kg).     GENERAL: Well-developed, well-nourished female in no acute distress.  HEENT: Normocephalic, atraumatic. Sclerae anicteric.  NECK: Supple. Normal thyroid.  LUNGS: Clear to auscultation bilaterally.  HEART: Regular rate and rhythm. BREASTS: Symmetric in size. No palpable masses or lymphadenopathy, skin changes, or nipple drainage. ABDOMEN: Soft, nontender, nondistended. Obese PELVIC: Normal external female genitalia. Vagina is pink and rugated.  Normal discharge. Normal appearing cervix. Bimanual exam limited by body habitus EXTREMITIES: No cyanosis, clubbing, or edema, 2+ distal pulses.    Assessment:    Healthy female exam.      Plan:    pap smear collected Wet prep collected Pelvic ultrasound ordered Patient will be contacted with results Discussed again with patient being normotensive prior to conception and to inform her PCP of conception plan Also discussed weight management to help her improve CHTN and outcome of pregnancy See After Visit Summary for Counseling Recommendations

## 2017-03-18 LAB — CERVICOVAGINAL ANCILLARY ONLY
BACTERIAL VAGINITIS: NEGATIVE
CHLAMYDIA, DNA PROBE: NEGATIVE
Candida vaginitis: NEGATIVE
NEISSERIA GONORRHEA: NEGATIVE
TRICH (WINDOWPATH): NEGATIVE

## 2017-03-20 LAB — CYTOLOGY - PAP
Adequacy: ABSENT
Diagnosis: NEGATIVE
HPV: NOT DETECTED

## 2017-03-27 ENCOUNTER — Ambulatory Visit (HOSPITAL_COMMUNITY)
Admission: RE | Admit: 2017-03-27 | Discharge: 2017-03-27 | Disposition: A | Discharge status: Home / Self Care | Payer: Medicaid Other | Source: Ambulatory Visit | Attending: Obstetrics and Gynecology | Admitting: Obstetrics and Gynecology

## 2017-03-27 ENCOUNTER — Ambulatory Visit (HOSPITAL_COMMUNITY): Payer: Medicaid Other

## 2017-03-27 DIAGNOSIS — R102 Pelvic and perineal pain: Secondary | ICD-10-CM | POA: Diagnosis present

## 2018-06-15 ENCOUNTER — Ambulatory Visit (INDEPENDENT_AMBULATORY_CARE_PROVIDER_SITE_OTHER): Payer: Medicaid Other | Admitting: General Practice

## 2018-06-15 VITALS — BP 125/80 | HR 78

## 2018-06-15 DIAGNOSIS — Z3201 Encounter for pregnancy test, result positive: Secondary | ICD-10-CM

## 2018-06-15 DIAGNOSIS — O3680X0 Pregnancy with inconclusive fetal viability, not applicable or unspecified: Secondary | ICD-10-CM

## 2018-06-15 DIAGNOSIS — O10019 Pre-existing essential hypertension complicating pregnancy, unspecified trimester: Secondary | ICD-10-CM

## 2018-06-15 LAB — POCT PREGNANCY, URINE: PREG TEST UR: POSITIVE — AB

## 2018-06-15 MED ORDER — PRENATAL PLUS 27-1 MG PO TABS: 1.0000 | ORAL_TABLET | Freq: Every day | ORAL | 11 refills | Status: DC

## 2018-06-15 MED ORDER — LABETALOL HCL 200 MG PO TABS: 200.0000 mg | ORAL_TABLET | Freq: Two times a day (BID) | ORAL | 1 refills | Status: DC

## 2018-06-15 NOTE — Progress Notes (Signed)
Chart reviewed - agree with RN documentation.   

## 2018-06-15 NOTE — Progress Notes (Signed)
Patient presents to office today for UPT. UPT +. Patient reports first positive home test on Sunday. Patient reports light spotting for 2 days about 3 weeks ago. Patient states she does have PCOS and does not have regular periods and isn't able to remember any bleeding prior to this. Patient states she is taking methadone 100mg  & losartan/hctz 100/25 combo. Called & discussed with Dr Adrian BlackwaterStinson who advises patient discontinue BP med and start labetalol 200mg  BID. Patient will need to return in a week for BP check. Will also schedule patient for ultrasound to confirm dating. Scheduled 2/10 & informed patient. PNV sent to pharmacy as well. Patient had no questions.  Chase Callerarrie H RN BSN 06/15/18

## 2018-06-21 ENCOUNTER — Ambulatory Visit (HOSPITAL_COMMUNITY)
Admission: RE | Admit: 2018-06-21 | Discharge: 2018-06-21 | Disposition: A | Discharge status: Home / Self Care | Payer: Medicaid Other | Source: Ambulatory Visit | Attending: Family Medicine | Admitting: Family Medicine

## 2018-06-21 ENCOUNTER — Ambulatory Visit: Payer: Medicaid Other | Admitting: *Deleted

## 2018-06-21 DIAGNOSIS — O3680X0 Pregnancy with inconclusive fetal viability, not applicable or unspecified: Secondary | ICD-10-CM

## 2018-06-21 NOTE — Progress Notes (Signed)
Patient ID: [name], female   DOB: 05/31/1985, 33 y.o.   MRN: [id number] I have reviewed the chart and agree with nursing staff's documentation of this patient's encounter.  Scheryl DarterJames Arnold, MD 06/21/2018 3:21 PM

## 2018-06-21 NOTE — Progress Notes (Signed)
Here for [place] results . REviewed with Dr. Debroah Loop and informed patient US shows live baby at [redacted]w[redacted]d with EDD 01/27/19. Advised to start prenatal care and prenatal vitamins. She voice understanding.  Linda,RN

## 2018-06-21 NOTE — Patient Instructions (Signed)
You are 8weeks 4 days pregnant. Your due date is 01/27/19.

## 2018-07-23 ENCOUNTER — Other Ambulatory Visit: Payer: Self-pay

## 2018-07-23 ENCOUNTER — Other Ambulatory Visit (HOSPITAL_COMMUNITY)
Admission: RE | Admit: 2018-07-23 | Discharge: 2018-07-23 | Disposition: A | Discharge status: Home / Self Care | Payer: Medicaid Other | Source: Ambulatory Visit | Attending: Family Medicine | Admitting: Family Medicine

## 2018-07-23 ENCOUNTER — Ambulatory Visit (INDEPENDENT_AMBULATORY_CARE_PROVIDER_SITE_OTHER): Payer: Medicaid Other | Admitting: *Deleted

## 2018-07-23 ENCOUNTER — Encounter: Payer: Self-pay | Admitting: *Deleted

## 2018-07-23 VITALS — BP 140/84 | HR 76 | Wt 232.8 lb

## 2018-07-23 DIAGNOSIS — O34219 Maternal care for unspecified type scar from previous cesarean delivery: Secondary | ICD-10-CM | POA: Insufficient documentation

## 2018-07-23 DIAGNOSIS — Z23 Encounter for immunization: Secondary | ICD-10-CM | POA: Diagnosis not present

## 2018-07-23 DIAGNOSIS — O10919 Unspecified pre-existing hypertension complicating pregnancy, unspecified trimester: Secondary | ICD-10-CM | POA: Insufficient documentation

## 2018-07-23 DIAGNOSIS — O0990 Supervision of high risk pregnancy, unspecified, unspecified trimester: Secondary | ICD-10-CM

## 2018-07-23 DIAGNOSIS — O99320 Drug use complicating pregnancy, unspecified trimester: Secondary | ICD-10-CM

## 2018-07-23 DIAGNOSIS — F1120 Opioid dependence, uncomplicated: Secondary | ICD-10-CM | POA: Insufficient documentation

## 2018-07-23 LAB — POCT URINALYSIS DIP (DEVICE)
Glucose, UA: NEGATIVE mg/dL
Hgb urine dipstick: NEGATIVE
Ketones, ur: 15 mg/dL — AB
Leukocytes,Ua: NEGATIVE
Nitrite: NEGATIVE
Protein, ur: 30 mg/dL — AB
Specific Gravity, Urine: >=1.03 (ref 1.005–1.030)
Urobilinogen, UA: 2 mg/dL — ABNORMAL HIGH (ref 0.0–1.0)
pH: 6 (ref 5.0–8.0)

## 2018-07-23 NOTE — Progress Notes (Signed)
Patient ID: [name], female   DOB: 05/25/1985, 33 y.o.   MRN: [id number] I have reviewed the chart and agree with nursing staff's documentation of this patient's encounter.  Scheryl Darter, MD 07/23/2018 2:06 PM

## 2018-07-23 NOTE — Progress Notes (Signed)
New Ob intake completed and pregnancy information packet given. Medicaid Home form completed and labs drawn. Pt reports no current concerns. Initial prenatal appt scheduled on 3/18.

## 2018-07-24 LAB — PROTEIN / CREATININE RATIO, URINE
CREATININE, UR: 217.9 mg/dL
PROTEIN UR: 15.3 mg/dL
PROTEIN/CREAT RATIO: 70 mg/g{creat} (ref 0–200)

## 2018-07-25 LAB — URINE CULTURE, OB REFLEX

## 2018-07-25 LAB — CULTURE, OB URINE

## 2018-07-26 LAB — GC/CHLAMYDIA PROBE AMP (~~LOC~~) NOT AT ARMC
Chlamydia: NEGATIVE
Neisseria Gonorrhea: NEGATIVE

## 2018-07-27 LAB — COMPREHENSIVE METABOLIC PANEL
A/G RATIO: 1.4 (ref 1.2–2.2)
ALT: 35 IU/L — ABNORMAL HIGH (ref 0–32)
AST: 24 IU/L (ref 0–40)
Albumin: 3.9 g/dL (ref 3.8–4.8)
Alkaline Phosphatase: 85 IU/L (ref 39–117)
BUN/Creatinine Ratio: 15 (ref 9–23)
BUN: 8 mg/dL (ref 6–20)
Bilirubin Total: 0.4 mg/dL (ref 0.0–1.2)
CHLORIDE: 99 mmol/L (ref 96–106)
CO2: 20 mmol/L (ref 20–29)
Calcium: 9.3 mg/dL (ref 8.7–10.2)
Creatinine, Ser: 0.55 mg/dL — ABNORMAL LOW (ref 0.57–1.00)
GFR calc Af Amer: 142 mL/min/{1.73_m2} (ref >59)
GFR calc non Af Amer: 123 mL/min/{1.73_m2} (ref >59)
GLOBULIN, TOTAL: 2.8 g/dL (ref 1.5–4.5)
Glucose: 79 mg/dL (ref 65–99)
POTASSIUM: 4.1 mmol/L (ref 3.5–5.2)
Sodium: 137 mmol/L (ref 134–144)
Total Protein: 6.7 g/dL (ref 6.0–8.5)

## 2018-07-27 LAB — OBSTETRIC PANEL, INCLUDING HIV
Antibody Screen: NEGATIVE
HIV Screen 4th Generation wRfx: NONREACTIVE
Hepatitis B Surface Ag: NEGATIVE
RPR Ser Ql: NONREACTIVE
Rh Factor: POSITIVE
Rubella Antibodies, IGG: 1.36 index (ref >0.99)

## 2018-07-27 LAB — TSH: TSH: 0.721 u[IU]/mL (ref 0.450–4.500)

## 2018-07-28 ENCOUNTER — Encounter: Payer: Self-pay | Admitting: Obstetrics & Gynecology

## 2018-07-28 ENCOUNTER — Institutional Professional Consult (permissible substitution): Payer: Self-pay

## 2018-07-28 ENCOUNTER — Telehealth: Payer: Self-pay | Admitting: Family Medicine

## 2018-07-28 ENCOUNTER — Other Ambulatory Visit: Payer: Self-pay | Admitting: Emergency Medicine

## 2018-07-28 DIAGNOSIS — O0990 Supervision of high risk pregnancy, unspecified, unspecified trimester: Secondary | ICD-10-CM

## 2018-07-28 NOTE — Progress Notes (Signed)
cb

## 2018-07-28 NOTE — Telephone Encounter (Signed)
The patient stated her car wont crank and she has to reschedule the appointment.

## 2018-08-02 ENCOUNTER — Encounter: Payer: Self-pay | Admitting: *Deleted

## 2018-08-12 ENCOUNTER — Other Ambulatory Visit: Payer: Self-pay

## 2018-08-12 ENCOUNTER — Ambulatory Visit: Payer: Self-pay | Admitting: Clinical

## 2018-08-12 ENCOUNTER — Ambulatory Visit (INDEPENDENT_AMBULATORY_CARE_PROVIDER_SITE_OTHER): Payer: Medicaid Other | Admitting: Family Medicine

## 2018-08-12 VITALS — BP 138/88 | HR 87 | Wt 231.2 lb

## 2018-08-12 DIAGNOSIS — O34219 Maternal care for unspecified type scar from previous cesarean delivery: Secondary | ICD-10-CM

## 2018-08-12 DIAGNOSIS — O0990 Supervision of high risk pregnancy, unspecified, unspecified trimester: Secondary | ICD-10-CM

## 2018-08-12 DIAGNOSIS — O10919 Unspecified pre-existing hypertension complicating pregnancy, unspecified trimester: Secondary | ICD-10-CM

## 2018-08-12 DIAGNOSIS — O10912 Unspecified pre-existing hypertension complicating pregnancy, second trimester: Secondary | ICD-10-CM

## 2018-08-12 DIAGNOSIS — O99320 Drug use complicating pregnancy, unspecified trimester: Secondary | ICD-10-CM

## 2018-08-12 DIAGNOSIS — O99322 Drug use complicating pregnancy, second trimester: Secondary | ICD-10-CM

## 2018-08-12 DIAGNOSIS — F1120 Opioid dependence, uncomplicated: Secondary | ICD-10-CM

## 2018-08-12 DIAGNOSIS — Z3A16 16 weeks gestation of pregnancy: Secondary | ICD-10-CM

## 2018-08-12 LAB — POCT URINALYSIS DIP (DEVICE)
Glucose, UA: NEGATIVE mg/dL
Hgb urine dipstick: NEGATIVE
Ketones, ur: NEGATIVE mg/dL
Leukocytes,Ua: NEGATIVE
Nitrite: NEGATIVE
Protein, ur: NEGATIVE mg/dL
Specific Gravity, Urine: >=1.03 (ref 1.005–1.030)
Urobilinogen, UA: 0.2 mg/dL (ref 0.0–1.0)
pH: 5.5 (ref 5.0–8.0)

## 2018-08-12 MED ORDER — ASPIRIN EC 81 MG PO TBEC: 81.0000 mg | DELAYED_RELEASE_TABLET | Freq: Every day | ORAL | 2 refills | Status: DC

## 2018-08-12 NOTE — BH Specialist Note (Signed)
Integrated Behavioral Health Initial Visit  MRN: [id number] Name: [name]  Number of Integrated Behavioral Health Clinician visits:: 1/6 Session Start time: 2:37  Session End time: 2:47 Total time: 15 minutes  Type of Service: Integrated Behavioral Health- Individual/Family Interpretor:No. Interpretor Name and Language: n/a   Warm Hand Off Completed.       SUBJECTIVE: [name] is a 33 y.o. female accompanied by n/a Patient was referred by Candelaria Celeste, DO for Initial OB introduction to integrated behavioral health services . Patient reports the following symptoms/concerns: Pt states she has been anxious over all the changes with COVID-19, no particular concerns today, but would like to speak further at a future visit.  Duration of problem: -; Severity of problem: -  OBJECTIVE: Mood: Normal and Affect: Appropriate Risk of harm to self or others: No plan to harm self or others  LIFE CONTEXT: Family and Social: - School/Work: - Self-Care: - Life Changes: Current pregnancy; COVID-19 pandemic  GOALS ADDRESSED: Patient will: 1. Reduce symptoms of: anxiety, depression and stress 2. Increase knowledge and/or ability of: stress reduction  3. Demonstrate ability to: Increase healthy adjustment to current life circumstances and Increase adequate support systems for patient/family  INTERVENTIONS: Interventions utilized: Psychoeducation and/or Health Education  Standardized Assessments completed: not given today  ASSESSMENT: Patient currently experiencing Supervision of high risk pregnancy, antepartum; Methadone maintenance treatment affecting pregnancy, antepartum and Chronic hypertension in pregnancy.   Will assess further with history of bipolar affective disorder, at next visit.    Patient may benefit from Initial OB introduction to integrated behavioral health services .  PLAN: 1. Follow up with behavioral health clinician on : One month, or earlier, as  requested 2. Behavioral recommendations:  -Continue taking prenatal vitamin, as recommended by medical provider -Take home and read educational materials today regarding coping with symptoms of anxiety, depression, and applicable community resources 3. Referral(s): Integrated Behavioral Health Services (In Clinic) 4. "From scale of 1-10, how likely are you to follow plan?": -  Rae Lips, LCSW  Depression screen [facility] 2/9 07/23/2018 03/17/2017  Decreased Interest 0 1  Down, Depressed, Hopeless 1 2  PHQ - 2 Score 1 3  Altered sleeping 3 3  Tired, decreased energy 3 3  Change in appetite 1 1  Feeling bad or failure about yourself  1 1  Trouble concentrating 1 1  Moving slowly or fidgety/restless 0 0  Suicidal thoughts 0 0  PHQ-9 Score 10 12   GAD 7 : Generalized Anxiety Score 07/23/2018 03/17/2017  Nervous, Anxious, on Edge 3 3  Control/stop worrying 3 3  Worry too much - different things 3 3  Trouble relaxing 3 3  Restless 3 3  Easily annoyed or irritable 1 2  Afraid - awful might happen 3 1  Total GAD 7 Score 19 18

## 2018-08-12 NOTE — Addendum Note (Signed)
Addended by: Gwendlyn Deutscher A on: 08/12/2018 03:07 PM   Modules accepted: Orders

## 2018-08-12 NOTE — Progress Notes (Signed)
Subjective:  [name] is a [id number] [redacted]w[redacted]d being seen today for her first obstetrical visit.  Her obstetrical history is significant for chronic hypertension. Preterm delivery secondary to severe preeclampsia - had emergent cesarean for fetal indications.. Patient does intend to breast feed. Pregnancy history fully reviewed.  Patient reports no complaints.  BP 138/88   Pulse 87   Wt 231 lb 3.2 oz (104.9 kg)   BMI 40.96 kg/m   HISTORY: OB History  Gravida Para Term Preterm AB Living  2 1   1   1   SAB TAB Ectopic Multiple Live Births          1    # Outcome Date GA Lbr Len/2nd Weight Sex Delivery Anes PTL Lv  2 Current           1 Preterm 2012 [redacted]w[redacted]d  3 lb 2 oz (1.417 kg) F CS-Unspec Spinal N LIV    Past Medical History:  Diagnosis Date  . Chronic back pain   . Chronic knee pain   . Drug abuse (HCC)   . Hypertension   . Long-term current use of methadone for opiate dependence (HCC)   . PCOS (polycystic ovarian syndrome)   . Scoliosis     Past Surgical History:  Procedure Laterality Date  . CESAREAN SECTION    . GANGLION CYST EXCISION    . KNEE SURGERY    . MULTIPLE TOOTH EXTRACTIONS     Pt has full upper and lower dentures    Family History  Problem Relation Age of Onset  . Diabetes Mother   . Heart disease Mother   . Hypertension Mother   . Varicose Veins Mother   . Arthritis Mother   . Anxiety disorder Mother   . Depression Mother   . Early death Mother   . Alcohol abuse Father   . Hypertension Father   . Stroke Father   . Anxiety disorder Father   . Depression Father   . Drug abuse Sister   . Alcohol abuse Sister      Exam  BP 138/88   Pulse 87   Wt 231 lb 3.2 oz (104.9 kg)   BMI 40.96 kg/m   CONSTITUTIONAL: Well-developed, well-nourished female in no acute distress.  HENT:  Normocephalic, atraumatic, External right and left ear normal. Oropharynx is clear and moist EYES: Conjunctivae and EOM are normal. Pupils are equal, round, and  reactive to light. No scleral icterus.  NECK: Normal range of motion, supple, no masses.  Normal thyroid.  CARDIOVASCULAR: Normal heart rate noted, regular rhythm RESPIRATORY: Clear to auscultation bilaterally. Effort and breath sounds normal, no problems with respiration noted. BREASTS: Symmetric in size. No masses, skin changes, nipple drainage, or lymphadenopathy. Has 1 cm sebaceous cyst left side of chest wall, just outside of breast margin. ABDOMEN: Soft, normal bowel sounds, no distention noted.  No tenderness, rebound or guarding.  PELVIC: Normal appearing external genitalia; normal appearing vaginal mucosa and cervix. No abnormal discharge noted. Normal uterine size, no other palpable masses, no uterine or adnexal tenderness. MUSCULOSKELETAL: Normal range of motion. No tenderness.  No cyanosis, clubbing, or edema.  2+ distal pulses. SKIN: Skin is warm and dry. No rash noted. Not diaphoretic. No erythema. No pallor. NEUROLOGIC: Alert and oriented to person, place, and time. Normal reflexes, muscle tone coordination. No cranial nerve deficit noted. PSYCHIATRIC: Normal mood and affect. Normal behavior. Normal judgment and thought content.    Assessment:    Pregnancy: G2P0101 Patient Active Problem List  Diagnosis Date Noted  . Supervision of high risk pregnancy, antepartum 07/23/2018  . Chronic hypertension in pregnancy 07/23/2018  . History of cesarean delivery, antepartum 07/23/2018  . Methadone maintenance treatment affecting pregnancy, antepartum (HCC) 07/23/2018  . BRONCHITIS, ACUTE 03/06/2009  . ACUTE SINUSITIS, UNSPECIFIED 04/01/2007  . ALLERGIC RHINITIS 04/01/2007  . BACK PAIN WITH RADICULOPATHY 04/01/2007  . POLYCYSTIC OVARIES 02/02/2007  . BIPOLAR AFFECTIVE DISORDER 02/02/2007  . HYPERTENSION 02/02/2007  . ASTHMA 02/02/2007  . HX, PERSONAL, TOBACCO USE 02/02/2007      Plan:   1. Supervision of high risk pregnancy, antepartum FHT and FH normal AFP and Panorama -  Genetic Screening - AFP, Serum, Open Spina Bifida - CBC - Hemoglobin A1c - CHL AMB BABYSCRIPTS SCHEDULE OPTIMIZATION  2. Chronic hypertension in pregnancy Continue labetalol ASA 81mg  CMP and UP:C done. Serial [place].   3. History of cesarean delivery, antepartum Desires elective repeat  4. Methadone maintenance treatment affecting pregnancy, antepartum (HCC) Stable. Will continue     Problem list reviewed and updated. 75% of 30 min visit spent on counseling and coordination of care.     Levie Heritage 08/12/2018

## 2018-08-15 LAB — CBC
Hematocrit: 39.1 % (ref 34.0–46.6)
Hemoglobin: 12.8 g/dL (ref 11.1–15.9)
MCH: 30.2 pg (ref 26.6–33.0)
MCHC: 32.7 g/dL (ref 31.5–35.7)
MCV: 92 fL (ref 79–97)
Platelets: 296 10*3/uL (ref 150–450)
RBC: 4.24 x10E6/uL (ref 3.77–5.28)
RDW: 14.1 % (ref 11.7–15.4)
WBC: 12.3 10*3/uL — ABNORMAL HIGH (ref 3.4–10.8)

## 2018-08-15 LAB — AFP, SERUM, OPEN SPINA BIFIDA
AFP MoM: 1.35
AFP Value: 34.5 ng/mL
Gest. Age on Collection Date: 16 weeks
Maternal Age At EDD: 34.5 yr
OSBR Risk 1 IN: 4151
Test Results:: NEGATIVE
Weight: 231 [lb_av]

## 2018-08-15 LAB — HEMOGLOBIN A1C
Est. average glucose Bld gHb Est-mCnc: 111 mg/dL
Hgb A1c MFr Bld: 5.5 % (ref 4.8–5.6)

## 2018-08-17 ENCOUNTER — Encounter: Payer: Self-pay | Admitting: *Deleted

## 2018-09-02 ENCOUNTER — Telehealth (INDEPENDENT_AMBULATORY_CARE_PROVIDER_SITE_OTHER): Payer: Medicaid Other | Admitting: *Deleted

## 2018-09-02 ENCOUNTER — Other Ambulatory Visit: Payer: Self-pay | Admitting: Family Medicine

## 2018-09-02 DIAGNOSIS — O10919 Unspecified pre-existing hypertension complicating pregnancy, unspecified trimester: Secondary | ICD-10-CM

## 2018-09-02 MED ORDER — LABETALOL HCL 200 MG PO TABS: 200.0000 mg | ORAL_TABLET | Freq: Two times a day (BID) | ORAL | 4 refills | Status: DC

## 2018-09-02 NOTE — Telephone Encounter (Signed)
Pt left a message stating she was calling because she needs a refill on her Labetalol.  Pt states she did not know that she did not have any refills on the medication and she has enough medication for today but would not have any for tomorrow.   Reviewed with Dr. Debroah Loop who authorized this refill with 4 additional refills.  Medication ordered and sent to [facility]'s pharmacy, [facility] [address].   Called pt to inform her that the Labetalol was refilled and sent to her pharmacy.  Pt verbalized understanding and expressed thanks.

## 2018-09-07 ENCOUNTER — Other Ambulatory Visit (HOSPITAL_COMMUNITY): Payer: Self-pay | Admitting: *Deleted

## 2018-09-07 ENCOUNTER — Ambulatory Visit (HOSPITAL_COMMUNITY)
Admission: RE | Admit: 2018-09-07 | Discharge: 2018-09-07 | Disposition: A | Discharge status: Home / Self Care | Payer: Medicaid Other | Source: Ambulatory Visit | Attending: Obstetrics and Gynecology | Admitting: Obstetrics and Gynecology

## 2018-09-07 ENCOUNTER — Other Ambulatory Visit: Payer: Self-pay

## 2018-09-07 ENCOUNTER — Ambulatory Visit (HOSPITAL_COMMUNITY): Payer: Medicaid Other | Admitting: *Deleted

## 2018-09-07 ENCOUNTER — Encounter (HOSPITAL_COMMUNITY): Payer: Self-pay

## 2018-09-07 VITALS — BP 126/65 | HR 82 | Temp 98.7°F

## 2018-09-07 DIAGNOSIS — F1120 Opioid dependence, uncomplicated: Secondary | ICD-10-CM

## 2018-09-07 DIAGNOSIS — O09292 Supervision of pregnancy with other poor reproductive or obstetric history, second trimester: Secondary | ICD-10-CM | POA: Diagnosis not present

## 2018-09-07 DIAGNOSIS — O99322 Drug use complicating pregnancy, second trimester: Secondary | ICD-10-CM

## 2018-09-07 DIAGNOSIS — Z363 Encounter for antenatal screening for malformations: Secondary | ICD-10-CM

## 2018-09-07 DIAGNOSIS — O10919 Unspecified pre-existing hypertension complicating pregnancy, unspecified trimester: Secondary | ICD-10-CM | POA: Insufficient documentation

## 2018-09-07 DIAGNOSIS — O0990 Supervision of high risk pregnancy, unspecified, unspecified trimester: Secondary | ICD-10-CM

## 2018-09-07 DIAGNOSIS — O99320 Drug use complicating pregnancy, unspecified trimester: Secondary | ICD-10-CM

## 2018-09-07 DIAGNOSIS — O09212 Supervision of pregnancy with history of pre-term labor, second trimester: Secondary | ICD-10-CM

## 2018-09-07 DIAGNOSIS — O34219 Maternal care for unspecified type scar from previous cesarean delivery: Secondary | ICD-10-CM | POA: Diagnosis not present

## 2018-09-07 DIAGNOSIS — Z3A18 18 weeks gestation of pregnancy: Secondary | ICD-10-CM

## 2018-09-07 DIAGNOSIS — O10012 Pre-existing essential hypertension complicating pregnancy, second trimester: Secondary | ICD-10-CM | POA: Diagnosis not present

## 2018-09-07 DIAGNOSIS — O99212 Obesity complicating pregnancy, second trimester: Secondary | ICD-10-CM

## 2018-09-09 ENCOUNTER — Ambulatory Visit (INDEPENDENT_AMBULATORY_CARE_PROVIDER_SITE_OTHER): Payer: Medicaid Other | Admitting: Obstetrics & Gynecology

## 2018-09-09 ENCOUNTER — Other Ambulatory Visit: Payer: Self-pay

## 2018-09-09 VITALS — BP 147/92 | HR 83

## 2018-09-09 DIAGNOSIS — F1120 Opioid dependence, uncomplicated: Secondary | ICD-10-CM | POA: Diagnosis not present

## 2018-09-09 DIAGNOSIS — O10012 Pre-existing essential hypertension complicating pregnancy, second trimester: Secondary | ICD-10-CM | POA: Diagnosis not present

## 2018-09-09 DIAGNOSIS — Z87891 Personal history of nicotine dependence: Secondary | ICD-10-CM

## 2018-09-09 DIAGNOSIS — I10 Essential (primary) hypertension: Secondary | ICD-10-CM

## 2018-09-09 DIAGNOSIS — O0990 Supervision of high risk pregnancy, unspecified, unspecified trimester: Secondary | ICD-10-CM

## 2018-09-09 DIAGNOSIS — O99320 Drug use complicating pregnancy, unspecified trimester: Secondary | ICD-10-CM

## 2018-09-09 DIAGNOSIS — O99322 Drug use complicating pregnancy, second trimester: Secondary | ICD-10-CM | POA: Diagnosis not present

## 2018-09-09 DIAGNOSIS — Z3A20 20 weeks gestation of pregnancy: Secondary | ICD-10-CM

## 2018-09-09 DIAGNOSIS — O0992 Supervision of high risk pregnancy, unspecified, second trimester: Secondary | ICD-10-CM

## 2018-09-09 NOTE — Progress Notes (Signed)
   TELEHEALTH VIRTUAL OBSTETRICS VISIT ENCOUNTER NOTE  I connected with [name] on 09/09/18 at  2:35 PM EDT by telephone at home and verified that I am speaking with the correct person using two identifiers.   I discussed the limitations, risks, security and privacy concerns of performing an evaluation and management service by telephone and the availability of in person appointments. I also discussed with the patient that there may be a patient responsible charge related to this service. The patient expressed understanding and agreed to proceed.  Subjective:  [name] is a 33 y.o. G2P0101 at [redacted]w[redacted]d being followed for ongoing prenatal care.  She is currently monitored for the following issues for this high-risk pregnancy and has POLYCYSTIC OVARIES; BIPOLAR AFFECTIVE DISORDER; Essential hypertension; ACUTE SINUSITIS, UNSPECIFIED; BRONCHITIS, ACUTE; ALLERGIC RHINITIS; ASTHMA; BACK PAIN WITH RADICULOPATHY; HX, PERSONAL, TOBACCO USE; Supervision of high risk pregnancy, antepartum; Chronic hypertension in pregnancy; History of cesarean delivery, antepartum; and Methadone maintenance treatment affecting pregnancy, antepartum (HCC) on their problem list.  Patient reports no complaints. Reports fetal movement. Denies any contractions, bleeding or leaking of fluid.   The following portions of the patient's history were reviewed and updated as appropriate: allergies, current medications, past family history, past medical history, past social history, past surgical history and problem list.   Objective:   General:  Alert, oriented and cooperative.   Mental Status: Normal mood and affect perceived. Normal judgment and thought content.  Rest of physical exam deferred due to type of encounter  Assessment and Plan:  Pregnancy: G2P0101 at [redacted]w[redacted]d 1. Supervision of high risk pregnancy, antepartum [place] report reviewed  2. Essential hypertension labetalol  3. Methadone maintenance treatment  affecting pregnancy, antepartum (HCC)   4. HX, PERSONAL, TOBACCO USE Urged to quit  Preterm labor symptoms and general obstetric precautions including but not limited to vaginal bleeding, contractions, leaking of fluid and fetal movement were reviewed in detail with the patient.  I discussed the assessment and treatment plan with the patient. The patient was provided an opportunity to ask questions and all were answered. The patient agreed with the plan and demonstrated an understanding of the instructions. The patient was advised to call back or seek an in-person office evaluation/go to MAU at [facility] for any urgent or concerning symptoms. Please refer to After Visit Summary for other counseling recommendations.   I provided 10 minutes of non-face-to-face time during this encounter.  Return in about 8 weeks (around 11/04/2018) for 2 hr.  Future Appointments  Date Time Provider Department Center  09/09/2018  2:35 PM Adam Phenix, MD [facility]  10/06/2018  9:15 AM [facility] NURSE [facility] MFC-US  10/06/2018  9:15 AM [facility]-M[facility] [place] 4 [facility]-MFCUS MFC-US    Scheryl Darter, MD Center for [facility], [facility]

## 2018-09-09 NOTE — Progress Notes (Signed)
I connected with  [name] on 09/09/18 at  2:35 PM EDT by telephone and verified that I am speaking with the correct person using two identifiers.   I discussed the limitations, risks, security and privacy concerns of performing an evaluation and management service by telephone and the availability of in person appointments. I also discussed with the patient that there may be a patient responsible charge related to this service. The patient expressed understanding and agreed to proceed.  [name], RN 09/09/2018  1:33 PM  Pt active in babyscripts.  Last logged 09/08/18.  Pt BP at home is 163/87.  Pt asymptomatic.  Pt states took labetalol 15 minutes prior to call and taking blood pressure.   Recheck BP 147/92.  Pt asymptomatic.

## 2018-09-09 NOTE — Patient Instructions (Signed)

## 2018-10-06 ENCOUNTER — Ambulatory Visit (HOSPITAL_COMMUNITY)
Admission: RE | Admit: 2018-10-06 | Discharge: 2018-10-06 | Disposition: A | Discharge status: Home / Self Care | Payer: Medicaid Other | Source: Ambulatory Visit | Attending: Maternal & Fetal Medicine | Admitting: Maternal & Fetal Medicine

## 2018-10-06 ENCOUNTER — Other Ambulatory Visit: Payer: Self-pay

## 2018-10-06 ENCOUNTER — Other Ambulatory Visit: Payer: Medicaid Other

## 2018-10-06 ENCOUNTER — Ambulatory Visit (HOSPITAL_COMMUNITY): Payer: Medicaid Other | Admitting: *Deleted

## 2018-10-06 ENCOUNTER — Encounter (HOSPITAL_COMMUNITY): Payer: Self-pay

## 2018-10-06 ENCOUNTER — Other Ambulatory Visit (HOSPITAL_COMMUNITY): Payer: Self-pay | Admitting: *Deleted

## 2018-10-06 VITALS — BP 140/77 | HR 78 | Temp 98.6°F

## 2018-10-06 DIAGNOSIS — Z362 Encounter for other antenatal screening follow-up: Secondary | ICD-10-CM

## 2018-10-06 DIAGNOSIS — O34219 Maternal care for unspecified type scar from previous cesarean delivery: Secondary | ICD-10-CM

## 2018-10-06 DIAGNOSIS — O09212 Supervision of pregnancy with history of pre-term labor, second trimester: Secondary | ICD-10-CM

## 2018-10-06 DIAGNOSIS — O99322 Drug use complicating pregnancy, second trimester: Secondary | ICD-10-CM | POA: Insufficient documentation

## 2018-10-06 DIAGNOSIS — F1120 Opioid dependence, uncomplicated: Secondary | ICD-10-CM

## 2018-10-06 DIAGNOSIS — O10919 Unspecified pre-existing hypertension complicating pregnancy, unspecified trimester: Secondary | ICD-10-CM | POA: Insufficient documentation

## 2018-10-06 DIAGNOSIS — O99212 Obesity complicating pregnancy, second trimester: Secondary | ICD-10-CM

## 2018-10-06 DIAGNOSIS — O10012 Pre-existing essential hypertension complicating pregnancy, second trimester: Secondary | ICD-10-CM | POA: Diagnosis not present

## 2018-10-06 DIAGNOSIS — Z3A23 23 weeks gestation of pregnancy: Secondary | ICD-10-CM

## 2018-10-20 ENCOUNTER — Encounter: Payer: Medicaid Other | Admitting: Obstetrics & Gynecology

## 2018-10-26 ENCOUNTER — Telehealth: Payer: Self-pay | Admitting: Family Medicine

## 2018-10-26 NOTE — Telephone Encounter (Signed)
Attempted to call patient about her appointment, and how she needed to have her MyChart app downloaded in order to have this visit. A voicemail was left for her to call the office in the morning.

## 2018-10-27 ENCOUNTER — Telehealth: Payer: Medicaid Other | Admitting: Family Medicine

## 2018-10-27 ENCOUNTER — Other Ambulatory Visit: Payer: Self-pay

## 2018-10-27 ENCOUNTER — Telehealth: Payer: Self-pay | Admitting: Family Medicine

## 2018-10-27 ENCOUNTER — Encounter: Payer: Self-pay | Admitting: Family Medicine

## 2018-10-27 DIAGNOSIS — Z5329 Procedure and treatment not carried out because of patient's decision for other reasons: Secondary | ICD-10-CM

## 2018-10-27 DIAGNOSIS — Z91199 Patient's noncompliance with other medical treatment and regimen due to unspecified reason: Secondary | ICD-10-CM

## 2018-10-27 NOTE — Progress Notes (Signed)
11:27a- Called pt at Mobile # VM stated [place].Called Home#, mother answered, pt still not home. Will retry once more.

## 2018-10-27 NOTE — Telephone Encounter (Signed)
Attempted to call patient to get her rescheduled for her OB appointment. Mobile number's voicemail stated it was for a Constant so no voicemail was left. Home number was called and the lady that answered the phone stated that "she doesn't think she is here". No show letter mailed

## 2018-10-27 NOTE — Progress Notes (Signed)
Patient did not keep appointment today. She will be called to reschedule.  

## 2018-10-27 NOTE — Progress Notes (Signed)
1113::Called pt regarding her appointment.  Pt did not pick up at the mobile number listed and the voicemail set up was for a different person.  Pt's mother picked up at the home number and stated that pt was not available.  Will attempt to call again in 15 minutes.

## 2018-11-03 ENCOUNTER — Ambulatory Visit (HOSPITAL_COMMUNITY)
Admission: RE | Admit: 2018-11-03 | Discharge: 2018-11-03 | Disposition: A | Discharge status: Home / Self Care | Payer: Medicaid Other | Source: Ambulatory Visit | Attending: Obstetrics and Gynecology | Admitting: Obstetrics and Gynecology

## 2018-11-03 ENCOUNTER — Ambulatory Visit (HOSPITAL_COMMUNITY): Payer: Medicaid Other

## 2018-11-03 ENCOUNTER — Telehealth: Payer: Self-pay | Admitting: Family Medicine

## 2018-11-03 NOTE — Telephone Encounter (Signed)
Attempted to call patient to ask her about any symptoms, and to wear her mask the whole visit covering her nose and mouth, and no visitors.  Also, to sanitize her hands upon arriving. A message was left on her voicemail.  °

## 2018-11-04 ENCOUNTER — Other Ambulatory Visit: Payer: Self-pay

## 2018-11-04 ENCOUNTER — Other Ambulatory Visit: Payer: Medicaid Other

## 2018-11-04 ENCOUNTER — Telehealth: Payer: Self-pay | Admitting: Family Medicine

## 2018-11-04 ENCOUNTER — Ambulatory Visit (INDEPENDENT_AMBULATORY_CARE_PROVIDER_SITE_OTHER): Payer: Medicaid Other | Admitting: Family Medicine

## 2018-11-04 VITALS — BP 130/86 | HR 82 | Wt 243.0 lb

## 2018-11-04 DIAGNOSIS — O34219 Maternal care for unspecified type scar from previous cesarean delivery: Secondary | ICD-10-CM | POA: Diagnosis not present

## 2018-11-04 DIAGNOSIS — Z23 Encounter for immunization: Secondary | ICD-10-CM | POA: Diagnosis not present

## 2018-11-04 DIAGNOSIS — O10919 Unspecified pre-existing hypertension complicating pregnancy, unspecified trimester: Secondary | ICD-10-CM

## 2018-11-04 DIAGNOSIS — O99323 Drug use complicating pregnancy, third trimester: Secondary | ICD-10-CM

## 2018-11-04 DIAGNOSIS — O0990 Supervision of high risk pregnancy, unspecified, unspecified trimester: Secondary | ICD-10-CM

## 2018-11-04 DIAGNOSIS — O10913 Unspecified pre-existing hypertension complicating pregnancy, third trimester: Secondary | ICD-10-CM

## 2018-11-04 DIAGNOSIS — O0993 Supervision of high risk pregnancy, unspecified, third trimester: Secondary | ICD-10-CM

## 2018-11-04 DIAGNOSIS — F1120 Opioid dependence, uncomplicated: Secondary | ICD-10-CM

## 2018-11-04 DIAGNOSIS — Z3A28 28 weeks gestation of pregnancy: Secondary | ICD-10-CM | POA: Diagnosis not present

## 2018-11-04 NOTE — Telephone Encounter (Signed)
The patient requested Dr. Kennon Rounds for the next visit.

## 2018-11-04 NOTE — Progress Notes (Signed)
   PRENATAL VISIT NOTE  Subjective:  [name] is a 33 y.o. G2P0101 at [redacted]w[redacted]d being seen today for ongoing prenatal care.  She is currently monitored for the following issues for this high-risk pregnancy and has POLYCYSTIC OVARIES; [place]; Essential hypertension; ACUTE SINUSITIS, UNSPECIFIED; BRONCHITIS, ACUTE; ALLERGIC RHINITIS; ASTHMA; BACK PAIN WITH RADICULOPATHY; HX, PERSONAL, TOBACCO USE; Supervision of high risk pregnancy, antepartum; Chronic hypertension in pregnancy; History of cesarean delivery, antepartum; and Methadone maintenance treatment affecting pregnancy, antepartum ([place]) on their problem list.  Patient reports no complaints.  Contractions: Not present. Vag. Bleeding: None.  Movement: Present. Denies leaking of fluid.   The following portions of the patient's history were reviewed and updated as appropriate: allergies, current medications, past family history, past medical history, past social history, past surgical history and problem list.   Objective:   Vitals:   11/04/18 0857  BP: 130/86  Pulse: 82  Weight: 243 lb (110.2 kg)    Fetal Status: Fetal Heart Rate (bpm): 132 Fundal Height: 29 cm Movement: Present     General:  Alert, oriented and cooperative. Patient is in no acute distress.  Skin: Skin is warm and dry. No rash noted.   Cardiovascular: Normal heart rate noted  Respiratory: Normal respiratory effort, no problems with respiration noted  Abdomen: Soft, gravid, appropriate for gestational age.  Pain/Pressure: Absent     Pelvic: Cervical exam deferred        Extremities: Normal range of motion.  Edema: None  Mental Status: Normal mood and affect. Normal behavior. Normal judgment and thought content.   Assessment and Plan:  Pregnancy: G2P0101 at [redacted]w[redacted]d 1. Chronic hypertension in pregnancy On labetalol and ASA--continue. BPs reviewed in [place] and is well controlled today  2. Supervision of high risk pregnancy, antepartum 28 wk  labs today - Tdap vaccine greater than or equal to 7yo IM  3. Methadone maintenance treatment affecting pregnancy, antepartum ([place]) On 100 mg daily Needs NICU tour  4. History of cesarean delivery, antepartum Booked for RCS and IUD at 11 wks--message sent  Preterm labor symptoms and general obstetric precautions including but not limited to vaginal bleeding, contractions, leaking of fluid and fetal movement were reviewed in detail with the patient. Please refer to After Visit Summary for other counseling recommendations.   Return in 2 weeks (on 11/18/2018) for virtual.  Future Appointments  Date Time Provider [place]  11/15/2018  9:45 AM [place] MFC-US  11/15/2018  9:45 AM [place] 2 WH-MFCUS MFC-US  11/18/2018  8:55 AM [place] Jude, MD WOC-[facility] WOC    Donnamae Jude, MD

## 2018-11-04 NOTE — Patient Instructions (Signed)

## 2018-11-05 LAB — CBC
Hematocrit: 34.4 % (ref 34.0–46.6)
Hemoglobin: 11.2 g/dL (ref 11.1–15.9)
MCH: 30.2 pg (ref 26.6–33.0)
MCHC: 32.6 g/dL (ref 31.5–35.7)
MCV: 93 fL (ref 79–97)
Platelets: 319 10*3/uL (ref 150–450)
RBC: 3.71 x10E6/uL — ABNORMAL LOW (ref 3.77–5.28)
RDW: 13.1 % (ref 11.7–15.4)
WBC: 12.2 10*3/uL — ABNORMAL HIGH (ref 3.4–10.8)

## 2018-11-05 LAB — HIV ANTIBODY (ROUTINE TESTING W REFLEX): HIV Screen 4th Generation wRfx: NONREACTIVE

## 2018-11-05 LAB — GLUCOSE TOLERANCE, 2 HOURS W/ 1HR
Glucose, 1 hour: 114 mg/dL (ref 65–179)
Glucose, 2 hour: 83 mg/dL (ref 65–152)
Glucose, Fasting: 81 mg/dL (ref 65–91)

## 2018-11-05 LAB — RPR: RPR Ser Ql: NONREACTIVE

## 2018-11-15 ENCOUNTER — Other Ambulatory Visit: Payer: Self-pay | Admitting: Obstetrics & Gynecology

## 2018-11-15 ENCOUNTER — Ambulatory Visit (HOSPITAL_COMMUNITY): Admission: RE | Admit: 2018-11-15 | Payer: Medicaid Other | Source: Ambulatory Visit

## 2018-11-15 ENCOUNTER — Encounter: Payer: Self-pay | Admitting: Obstetrics & Gynecology

## 2018-11-15 ENCOUNTER — Ambulatory Visit (HOSPITAL_COMMUNITY): Payer: Medicaid Other

## 2018-11-18 ENCOUNTER — Telehealth (INDEPENDENT_AMBULATORY_CARE_PROVIDER_SITE_OTHER): Payer: Medicaid Other | Admitting: Family Medicine

## 2018-11-18 ENCOUNTER — Other Ambulatory Visit: Payer: Self-pay

## 2018-11-18 DIAGNOSIS — Z3A30 30 weeks gestation of pregnancy: Secondary | ICD-10-CM

## 2018-11-18 DIAGNOSIS — O0993 Supervision of high risk pregnancy, unspecified, third trimester: Secondary | ICD-10-CM

## 2018-11-18 DIAGNOSIS — F1120 Opioid dependence, uncomplicated: Secondary | ICD-10-CM

## 2018-11-18 DIAGNOSIS — O34219 Maternal care for unspecified type scar from previous cesarean delivery: Secondary | ICD-10-CM

## 2018-11-18 DIAGNOSIS — O10919 Unspecified pre-existing hypertension complicating pregnancy, unspecified trimester: Secondary | ICD-10-CM

## 2018-11-18 DIAGNOSIS — O10913 Unspecified pre-existing hypertension complicating pregnancy, third trimester: Secondary | ICD-10-CM

## 2018-11-18 DIAGNOSIS — O99323 Drug use complicating pregnancy, third trimester: Secondary | ICD-10-CM | POA: Diagnosis not present

## 2018-11-18 DIAGNOSIS — O0990 Supervision of high risk pregnancy, unspecified, unspecified trimester: Secondary | ICD-10-CM

## 2018-11-18 NOTE — Progress Notes (Signed)
I connected with  Aris Lot on 11/18/18 at  8:55 AM EDT by telephone and verified that I am speaking with the correct person using two identifiers.   I discussed the limitations, risks, security and privacy concerns of performing an evaluation and management service by telephone and the availability of in person appointments. I also discussed with the patient that there may be a patient responsible charge related to this service. The patient expressed understanding and agreed to proceed.  Pt reports that she has not taken BP medication this am.   Verdell Carmine, RN 11/18/2018  8:41 AM

## 2018-11-18 NOTE — Patient Instructions (Signed)

## 2018-11-18 NOTE — Progress Notes (Signed)
Pt reports that she has not taken her BP medication as of this am

## 2018-11-18 NOTE — Progress Notes (Signed)
    PRENATAL VISIT NOTE  Subjective:  [name] is a 32 y.o. G2P0101 at [redacted]w[redacted]d being seen today for ongoing prenatal care.  She is currently monitored for the following issues for this high-risk pregnancy and has POLYCYSTIC OVARIES; [place]; Essential hypertension; ALLERGIC RHINITIS; ASTHMA; BACK PAIN WITH RADICULOPATHY; HX, PERSONAL, TOBACCO USE; Supervision of high risk pregnancy, antepartum; Chronic hypertension in pregnancy; History of cesarean delivery, antepartum; and Methadone maintenance treatment affecting pregnancy, antepartum ([place]) on their problem list.  Patient reports no complaints.  Contractions: Not present. Vag. Bleeding: None.  Movement: Present. Denies leaking of fluid.   The following portions of the patient's history were reviewed and updated as appropriate: allergies, current medications, past family history, past medical history, past social history, past surgical history and problem list.   Objective:  There were no vitals filed for this visit.  Fetal Status:     Movement: Present     General:  Alert, oriented and cooperative. Patient is in no acute distress.  Skin: Skin is warm and dry. No rash noted.   Cardiovascular: Normal heart rate noted  Respiratory: Normal respiratory effort, no problems with respiration noted  Abdomen: Soft, gravid, appropriate for gestational age.  Pain/Pressure: Present     Pelvic: Cervical exam deferred        Extremities: Normal range of motion.  Edema: Trace  Mental Status: Normal mood and affect. Normal behavior. Normal judgment and thought content.   Assessment and Plan:  Pregnancy: G2P0101 at [redacted]w[redacted]d 1. Chronic hypertension in pregnancy On Labetalol and ASA Bps reviewed in BabyScripts and ok   2. Supervision of high risk pregnancy, antepartum Passed 28 wk labs  3. History of cesarean delivery, antepartum Booked for RCS 9/10 with IUD placement  4. Methadone maintenance treatment affecting pregnancy,  antepartum ([place]) Continue daily MAT  Preterm labor symptoms and general obstetric precautions including but not limited to vaginal bleeding, contractions, leaking of fluid and fetal movement were reviewed in detail with the patient. Please refer to After Visit Summary for other counseling recommendations.   Return in 2 weeks (on 12/02/2018) for  in person, OB visit and BPP.  Future Appointments  Date Time Provider [place]  11/19/2018  3:45 PM [place] MFC-US  11/19/2018  3:45 PM WH-MFC [place] 2 WH-MFCUS MFC-US    Donnamae Jude, MD

## 2018-11-19 ENCOUNTER — Ambulatory Visit (HOSPITAL_COMMUNITY)
Admission: RE | Admit: 2018-11-19 | Discharge: 2018-11-19 | Disposition: A | Discharge status: Home / Self Care | Payer: Medicaid Other | Source: Ambulatory Visit | Attending: Obstetrics and Gynecology | Admitting: Obstetrics and Gynecology

## 2018-11-19 ENCOUNTER — Other Ambulatory Visit: Payer: Self-pay

## 2018-11-19 ENCOUNTER — Ambulatory Visit (HOSPITAL_COMMUNITY): Payer: Medicaid Other | Admitting: *Deleted

## 2018-11-19 ENCOUNTER — Encounter (HOSPITAL_COMMUNITY): Payer: Self-pay

## 2018-11-19 DIAGNOSIS — O34219 Maternal care for unspecified type scar from previous cesarean delivery: Secondary | ICD-10-CM | POA: Diagnosis not present

## 2018-11-19 DIAGNOSIS — O09213 Supervision of pregnancy with history of pre-term labor, third trimester: Secondary | ICD-10-CM

## 2018-11-19 DIAGNOSIS — O09293 Supervision of pregnancy with other poor reproductive or obstetric history, third trimester: Secondary | ICD-10-CM

## 2018-11-19 DIAGNOSIS — O10013 Pre-existing essential hypertension complicating pregnancy, third trimester: Secondary | ICD-10-CM | POA: Diagnosis not present

## 2018-11-19 DIAGNOSIS — O0990 Supervision of high risk pregnancy, unspecified, unspecified trimester: Secondary | ICD-10-CM | POA: Insufficient documentation

## 2018-11-19 DIAGNOSIS — O99213 Obesity complicating pregnancy, third trimester: Secondary | ICD-10-CM

## 2018-11-19 DIAGNOSIS — F1120 Opioid dependence, uncomplicated: Secondary | ICD-10-CM

## 2018-11-19 DIAGNOSIS — Z3A30 30 weeks gestation of pregnancy: Secondary | ICD-10-CM

## 2018-11-19 DIAGNOSIS — O10919 Unspecified pre-existing hypertension complicating pregnancy, unspecified trimester: Secondary | ICD-10-CM | POA: Insufficient documentation

## 2018-11-19 DIAGNOSIS — O99322 Drug use complicating pregnancy, second trimester: Secondary | ICD-10-CM | POA: Diagnosis not present

## 2018-11-19 DIAGNOSIS — O99320 Drug use complicating pregnancy, unspecified trimester: Secondary | ICD-10-CM

## 2018-11-19 DIAGNOSIS — Z362 Encounter for other antenatal screening follow-up: Secondary | ICD-10-CM

## 2018-11-20 ENCOUNTER — Other Ambulatory Visit (HOSPITAL_COMMUNITY): Payer: Self-pay | Admitting: *Deleted

## 2018-11-20 DIAGNOSIS — O10919 Unspecified pre-existing hypertension complicating pregnancy, unspecified trimester: Secondary | ICD-10-CM

## 2018-11-25 ENCOUNTER — Ambulatory Visit (HOSPITAL_COMMUNITY): Payer: Medicaid Other | Admitting: *Deleted

## 2018-11-25 ENCOUNTER — Encounter (HOSPITAL_COMMUNITY): Payer: Self-pay

## 2018-11-25 ENCOUNTER — Ambulatory Visit (HOSPITAL_COMMUNITY)
Admission: RE | Admit: 2018-11-25 | Discharge: 2018-11-25 | Disposition: A | Discharge status: Home / Self Care | Payer: Medicaid Other | Source: Ambulatory Visit | Attending: Obstetrics and Gynecology | Admitting: Obstetrics and Gynecology

## 2018-11-25 ENCOUNTER — Other Ambulatory Visit (HOSPITAL_COMMUNITY): Payer: Self-pay | Admitting: Obstetrics and Gynecology

## 2018-11-25 ENCOUNTER — Other Ambulatory Visit (HOSPITAL_COMMUNITY): Payer: Self-pay | Admitting: *Deleted

## 2018-11-25 ENCOUNTER — Other Ambulatory Visit: Payer: Self-pay

## 2018-11-25 VITALS — BP 153/86 | HR 82 | Temp 98.8°F

## 2018-11-25 DIAGNOSIS — O34219 Maternal care for unspecified type scar from previous cesarean delivery: Secondary | ICD-10-CM

## 2018-11-25 DIAGNOSIS — O289 Unspecified abnormal findings on antenatal screening of mother: Secondary | ICD-10-CM

## 2018-11-25 DIAGNOSIS — O10919 Unspecified pre-existing hypertension complicating pregnancy, unspecified trimester: Secondary | ICD-10-CM | POA: Insufficient documentation

## 2018-11-25 DIAGNOSIS — Z3A31 31 weeks gestation of pregnancy: Secondary | ICD-10-CM

## 2018-11-25 DIAGNOSIS — F1910 Other psychoactive substance abuse, uncomplicated: Secondary | ICD-10-CM

## 2018-11-25 DIAGNOSIS — O09293 Supervision of pregnancy with other poor reproductive or obstetric history, third trimester: Secondary | ICD-10-CM

## 2018-11-25 DIAGNOSIS — O09213 Supervision of pregnancy with history of pre-term labor, third trimester: Secondary | ICD-10-CM | POA: Diagnosis not present

## 2018-11-25 DIAGNOSIS — O10013 Pre-existing essential hypertension complicating pregnancy, third trimester: Secondary | ICD-10-CM | POA: Diagnosis not present

## 2018-11-25 DIAGNOSIS — O99323 Drug use complicating pregnancy, third trimester: Secondary | ICD-10-CM

## 2018-11-25 DIAGNOSIS — O99213 Obesity complicating pregnancy, third trimester: Secondary | ICD-10-CM

## 2018-11-25 DIAGNOSIS — F1120 Opioid dependence, uncomplicated: Secondary | ICD-10-CM

## 2018-11-25 NOTE — Procedures (Signed)
[name] 03-27-1986 [redacted]w[redacted]d  Fetus A Non-Stress Test Interpretation for 11/25/18  Indication: Unsatisfactory BPP  Fetal Heart Rate A Mode: External Baseline Rate (A): 140 bpm Variability: Moderate Accelerations: None Decelerations: None Multiple birth?: No  Uterine Activity Mode: Palpation, Toco Contraction Frequency (min): x3 with U/I Contraction Duration (sec): 40-60 Contraction Quality: Mild Resting Tone Palpated: Relaxed Resting Time: Adequate  Interpretation (Fetal Testing) Nonstress Test Interpretation: Non-reactive Comments: Reviewed tracing with Dr. Donalee Citrin; pt to return for BPP

## 2018-11-26 ENCOUNTER — Ambulatory Visit (HOSPITAL_COMMUNITY): Payer: Medicaid Other | Admitting: *Deleted

## 2018-11-26 ENCOUNTER — Encounter (HOSPITAL_COMMUNITY): Payer: Self-pay

## 2018-11-26 ENCOUNTER — Ambulatory Visit (HOSPITAL_COMMUNITY)
Admission: RE | Admit: 2018-11-26 | Discharge: 2018-11-26 | Disposition: A | Discharge status: Home / Self Care | Payer: Medicaid Other | Source: Ambulatory Visit | Attending: Obstetrics and Gynecology | Admitting: Obstetrics and Gynecology

## 2018-11-26 DIAGNOSIS — F1120 Opioid dependence, uncomplicated: Secondary | ICD-10-CM

## 2018-11-26 DIAGNOSIS — O10919 Unspecified pre-existing hypertension complicating pregnancy, unspecified trimester: Secondary | ICD-10-CM | POA: Diagnosis present

## 2018-11-26 DIAGNOSIS — O10013 Pre-existing essential hypertension complicating pregnancy, third trimester: Secondary | ICD-10-CM | POA: Diagnosis not present

## 2018-11-26 DIAGNOSIS — O0990 Supervision of high risk pregnancy, unspecified, unspecified trimester: Secondary | ICD-10-CM | POA: Diagnosis present

## 2018-11-26 DIAGNOSIS — O34219 Maternal care for unspecified type scar from previous cesarean delivery: Secondary | ICD-10-CM

## 2018-11-26 DIAGNOSIS — O09293 Supervision of pregnancy with other poor reproductive or obstetric history, third trimester: Secondary | ICD-10-CM | POA: Diagnosis not present

## 2018-11-26 DIAGNOSIS — F1910 Other psychoactive substance abuse, uncomplicated: Secondary | ICD-10-CM

## 2018-11-26 DIAGNOSIS — O09213 Supervision of pregnancy with history of pre-term labor, third trimester: Secondary | ICD-10-CM

## 2018-11-26 DIAGNOSIS — O99213 Obesity complicating pregnancy, third trimester: Secondary | ICD-10-CM

## 2018-11-26 DIAGNOSIS — O99320 Drug use complicating pregnancy, unspecified trimester: Secondary | ICD-10-CM | POA: Diagnosis not present

## 2018-11-26 DIAGNOSIS — O99323 Drug use complicating pregnancy, third trimester: Secondary | ICD-10-CM

## 2018-11-26 DIAGNOSIS — Z3A31 31 weeks gestation of pregnancy: Secondary | ICD-10-CM

## 2018-12-03 ENCOUNTER — Ambulatory Visit (HOSPITAL_COMMUNITY): Payer: Medicaid Other | Admitting: *Deleted

## 2018-12-03 ENCOUNTER — Encounter (HOSPITAL_COMMUNITY): Payer: Self-pay

## 2018-12-03 ENCOUNTER — Ambulatory Visit (INDEPENDENT_AMBULATORY_CARE_PROVIDER_SITE_OTHER): Payer: Medicaid Other | Admitting: Family Medicine

## 2018-12-03 ENCOUNTER — Other Ambulatory Visit: Payer: Self-pay

## 2018-12-03 ENCOUNTER — Ambulatory Visit (HOSPITAL_COMMUNITY)
Admission: RE | Admit: 2018-12-03 | Discharge: 2018-12-03 | Disposition: A | Discharge status: Home / Self Care | Payer: Medicaid Other | Source: Ambulatory Visit | Attending: Obstetrics and Gynecology | Admitting: Obstetrics and Gynecology

## 2018-12-03 VITALS — BP 137/92 | HR 86 | Temp 98.1°F | Wt 246.0 lb

## 2018-12-03 DIAGNOSIS — O99320 Drug use complicating pregnancy, unspecified trimester: Secondary | ICD-10-CM | POA: Diagnosis present

## 2018-12-03 DIAGNOSIS — O09293 Supervision of pregnancy with other poor reproductive or obstetric history, third trimester: Secondary | ICD-10-CM | POA: Diagnosis not present

## 2018-12-03 DIAGNOSIS — F1120 Opioid dependence, uncomplicated: Secondary | ICD-10-CM | POA: Diagnosis present

## 2018-12-03 DIAGNOSIS — O10919 Unspecified pre-existing hypertension complicating pregnancy, unspecified trimester: Secondary | ICD-10-CM

## 2018-12-03 DIAGNOSIS — O10013 Pre-existing essential hypertension complicating pregnancy, third trimester: Secondary | ICD-10-CM

## 2018-12-03 DIAGNOSIS — O99323 Drug use complicating pregnancy, third trimester: Secondary | ICD-10-CM

## 2018-12-03 DIAGNOSIS — O10913 Unspecified pre-existing hypertension complicating pregnancy, third trimester: Secondary | ICD-10-CM

## 2018-12-03 DIAGNOSIS — O0990 Supervision of high risk pregnancy, unspecified, unspecified trimester: Secondary | ICD-10-CM

## 2018-12-03 DIAGNOSIS — O34219 Maternal care for unspecified type scar from previous cesarean delivery: Secondary | ICD-10-CM | POA: Insufficient documentation

## 2018-12-03 DIAGNOSIS — O99213 Obesity complicating pregnancy, third trimester: Secondary | ICD-10-CM

## 2018-12-03 DIAGNOSIS — O09213 Supervision of pregnancy with history of pre-term labor, third trimester: Secondary | ICD-10-CM

## 2018-12-03 DIAGNOSIS — F1910 Other psychoactive substance abuse, uncomplicated: Secondary | ICD-10-CM

## 2018-12-03 DIAGNOSIS — O0993 Supervision of high risk pregnancy, unspecified, third trimester: Secondary | ICD-10-CM

## 2018-12-03 DIAGNOSIS — Z3A32 32 weeks gestation of pregnancy: Secondary | ICD-10-CM

## 2018-12-03 DIAGNOSIS — F3130 Bipolar disorder, current episode depressed, mild or moderate severity, unspecified: Secondary | ICD-10-CM

## 2018-12-03 NOTE — Progress Notes (Signed)
   PRENATAL VISIT NOTE  Subjective:  [name] is a 33 y.o. G2P0101 at [redacted]w[redacted]d being seen today for ongoing prenatal care.  She is currently monitored for the following issues for this high-risk pregnancy and has POLYCYSTIC OVARIES; [place]; Essential hypertension; ALLERGIC RHINITIS; ASTHMA; BACK PAIN WITH RADICULOPATHY; HX, PERSONAL, TOBACCO USE; Supervision of high risk pregnancy, antepartum; Chronic hypertension in pregnancy; History of cesarean delivery, antepartum; and Methadone maintenance treatment affecting pregnancy, antepartum ([place]) on their problem list.  Patient reports no complaints.  Contractions: Not present. Vag. Bleeding: None.  Movement: Present. Denies leaking of fluid.   The following portions of the patient's history were reviewed and updated as appropriate: allergies, current medications, past family history, past medical history, past social history, past surgical history and problem list.   Objective:   Vitals:   12/03/18 1000 12/03/18 1004  BP: (!) 142/90 (!) 137/92  Pulse: 86   Temp: 98.1 F (36.7 C)   Weight: 246 lb (111.6 kg)     Fetal Status: Fetal Heart Rate (bpm): 140   Movement: Present     General:  Alert, oriented and cooperative. Patient is in no acute distress.  Skin: Skin is warm and dry. No rash noted.   Cardiovascular: Normal heart rate noted  Respiratory: Normal respiratory effort, no problems with respiration noted  Abdomen: Soft, gravid, appropriate for gestational age.  Pain/Pressure: Present     Pelvic: Cervical exam deferred        Extremities: Normal range of motion.  Edema: Trace  Mental Status: Normal mood and affect. Normal behavior. Normal judgment and thought content.   Assessment and Plan:  Pregnancy: G2P0101 at [redacted]w[redacted]d 1. Supervision of high risk pregnancy, antepartum FHT and FH normal  2. Methadone maintenance treatment affecting pregnancy, antepartum ([place]) Stable  3. History of cesarean delivery,  antepartum Scheduled for repeat c/s  4. Chronic hypertension in pregnancy Labetalol 200mg  BID Asa 81mg  daily In antenatal testing  5. Bipolar affective disorder, current episode depressed, current episode severity unspecified ([place])   Preterm labor symptoms and general obstetric precautions including but not limited to vaginal bleeding, contractions, leaking of fluid and fetal movement were reviewed in detail with the patient. Please refer to After Visit Summary for other counseling recommendations.   No follow-ups on file.  Future Appointments  Date Time Provider [place]  12/03/2018 11:10 AM [place] MFC-US  12/03/2018 11:15 AM WH-MFC [place] 4 WH-MFCUS MFC-US  12/10/2018 10:00 AM WH-MFC NURSE [place] MFC-US  12/10/2018 10:00 AM [place] 3 WH-MFCUS MFC-US    Truett Mainland, DO

## 2018-12-08 ENCOUNTER — Other Ambulatory Visit: Payer: Self-pay | Admitting: Obstetrics and Gynecology

## 2018-12-10 ENCOUNTER — Other Ambulatory Visit (HOSPITAL_COMMUNITY): Payer: Self-pay | Admitting: *Deleted

## 2018-12-10 ENCOUNTER — Ambulatory Visit (HOSPITAL_COMMUNITY)
Admission: RE | Admit: 2018-12-10 | Discharge: 2018-12-10 | Disposition: A | Discharge status: Home / Self Care | Payer: Medicaid Other | Source: Ambulatory Visit | Attending: Obstetrics and Gynecology | Admitting: Obstetrics and Gynecology

## 2018-12-10 ENCOUNTER — Encounter (HOSPITAL_COMMUNITY): Payer: Self-pay

## 2018-12-10 ENCOUNTER — Other Ambulatory Visit: Payer: Self-pay

## 2018-12-10 ENCOUNTER — Ambulatory Visit (HOSPITAL_COMMUNITY): Payer: Medicaid Other | Admitting: *Deleted

## 2018-12-10 ENCOUNTER — Other Ambulatory Visit (HOSPITAL_COMMUNITY): Payer: Self-pay | Admitting: Obstetrics and Gynecology

## 2018-12-10 DIAGNOSIS — O09293 Supervision of pregnancy with other poor reproductive or obstetric history, third trimester: Secondary | ICD-10-CM

## 2018-12-10 DIAGNOSIS — O0990 Supervision of high risk pregnancy, unspecified, unspecified trimester: Secondary | ICD-10-CM

## 2018-12-10 DIAGNOSIS — O99323 Drug use complicating pregnancy, third trimester: Secondary | ICD-10-CM

## 2018-12-10 DIAGNOSIS — O289 Unspecified abnormal findings on antenatal screening of mother: Secondary | ICD-10-CM

## 2018-12-10 DIAGNOSIS — O34219 Maternal care for unspecified type scar from previous cesarean delivery: Secondary | ICD-10-CM

## 2018-12-10 DIAGNOSIS — O10919 Unspecified pre-existing hypertension complicating pregnancy, unspecified trimester: Secondary | ICD-10-CM

## 2018-12-10 DIAGNOSIS — F1120 Opioid dependence, uncomplicated: Secondary | ICD-10-CM | POA: Diagnosis present

## 2018-12-10 DIAGNOSIS — F1910 Other psychoactive substance abuse, uncomplicated: Secondary | ICD-10-CM

## 2018-12-10 DIAGNOSIS — Z3A33 33 weeks gestation of pregnancy: Secondary | ICD-10-CM

## 2018-12-10 DIAGNOSIS — Z362 Encounter for other antenatal screening follow-up: Secondary | ICD-10-CM | POA: Diagnosis not present

## 2018-12-10 DIAGNOSIS — O99213 Obesity complicating pregnancy, third trimester: Secondary | ICD-10-CM

## 2018-12-10 DIAGNOSIS — O09213 Supervision of pregnancy with history of pre-term labor, third trimester: Secondary | ICD-10-CM

## 2018-12-10 DIAGNOSIS — O10013 Pre-existing essential hypertension complicating pregnancy, third trimester: Secondary | ICD-10-CM

## 2018-12-10 DIAGNOSIS — O99320 Drug use complicating pregnancy, unspecified trimester: Secondary | ICD-10-CM | POA: Insufficient documentation

## 2018-12-10 NOTE — Procedures (Signed)
[name] Apr 02, 1986 [redacted]w[redacted]d  Fetus A Non-Stress Test Interpretation for 12/10/18  Indication: IUGR  Fetal Heart Rate A Mode: External Baseline Rate (A): 130 bpm Variability: Moderate Accelerations: 15 x 15 Decelerations: None Multiple birth?: No  Uterine Activity Mode: Palpation, Toco Contraction Frequency (min): 2-4 Contraction Duration (sec): 40-60 Contraction Quality: Mild Resting Tone Palpated: Relaxed Resting Time: Adequate  Interpretation (Fetal Testing) Nonstress Test Interpretation: Reactive Comments: Reviewed tracing with Dr. Donalee Citrin

## 2018-12-15 ENCOUNTER — Ambulatory Visit (HOSPITAL_COMMUNITY)
Admission: RE | Admit: 2018-12-15 | Discharge: 2018-12-15 | Disposition: A | Discharge status: Home / Self Care | Payer: Medicaid Other | Source: Ambulatory Visit | Attending: Obstetrics and Gynecology | Admitting: Obstetrics and Gynecology

## 2018-12-15 ENCOUNTER — Other Ambulatory Visit: Payer: Self-pay

## 2018-12-15 ENCOUNTER — Ambulatory Visit (HOSPITAL_COMMUNITY): Payer: Medicaid Other | Admitting: *Deleted

## 2018-12-15 ENCOUNTER — Encounter (HOSPITAL_COMMUNITY): Payer: Self-pay | Admitting: *Deleted

## 2018-12-15 DIAGNOSIS — F1120 Opioid dependence, uncomplicated: Secondary | ICD-10-CM | POA: Insufficient documentation

## 2018-12-15 DIAGNOSIS — O99320 Drug use complicating pregnancy, unspecified trimester: Secondary | ICD-10-CM | POA: Diagnosis present

## 2018-12-15 DIAGNOSIS — O0990 Supervision of high risk pregnancy, unspecified, unspecified trimester: Secondary | ICD-10-CM | POA: Insufficient documentation

## 2018-12-15 DIAGNOSIS — O10013 Pre-existing essential hypertension complicating pregnancy, third trimester: Secondary | ICD-10-CM

## 2018-12-15 DIAGNOSIS — O09293 Supervision of pregnancy with other poor reproductive or obstetric history, third trimester: Secondary | ICD-10-CM | POA: Diagnosis not present

## 2018-12-15 DIAGNOSIS — O09213 Supervision of pregnancy with history of pre-term labor, third trimester: Secondary | ICD-10-CM | POA: Diagnosis not present

## 2018-12-15 DIAGNOSIS — F1910 Other psychoactive substance abuse, uncomplicated: Secondary | ICD-10-CM

## 2018-12-15 DIAGNOSIS — O10919 Unspecified pre-existing hypertension complicating pregnancy, unspecified trimester: Secondary | ICD-10-CM | POA: Diagnosis not present

## 2018-12-15 DIAGNOSIS — O34219 Maternal care for unspecified type scar from previous cesarean delivery: Secondary | ICD-10-CM

## 2018-12-15 DIAGNOSIS — O99213 Obesity complicating pregnancy, third trimester: Secondary | ICD-10-CM

## 2018-12-15 DIAGNOSIS — O99323 Drug use complicating pregnancy, third trimester: Secondary | ICD-10-CM

## 2018-12-15 DIAGNOSIS — Z3A33 33 weeks gestation of pregnancy: Secondary | ICD-10-CM

## 2018-12-16 ENCOUNTER — Telehealth: Payer: Self-pay | Admitting: Obstetrics & Gynecology

## 2018-12-16 NOTE — Telephone Encounter (Signed)
Called the patient and left a detailed voicemail of the covid19 restrictions as well as if she has been in close contact with someone who has had covid, diagnosed with covid19, or experienced any flu like symptoms such as fever, rash, sore throat, or shortness of breath please call our office to reschedule. °

## 2018-12-17 ENCOUNTER — Ambulatory Visit (INDEPENDENT_AMBULATORY_CARE_PROVIDER_SITE_OTHER): Payer: Medicaid Other | Admitting: Family Medicine

## 2018-12-17 ENCOUNTER — Other Ambulatory Visit: Payer: Self-pay

## 2018-12-17 VITALS — BP 130/95 | HR 86 | Temp 98.4°F

## 2018-12-17 DIAGNOSIS — O0990 Supervision of high risk pregnancy, unspecified, unspecified trimester: Secondary | ICD-10-CM

## 2018-12-17 DIAGNOSIS — O10919 Unspecified pre-existing hypertension complicating pregnancy, unspecified trimester: Secondary | ICD-10-CM

## 2018-12-17 DIAGNOSIS — O34219 Maternal care for unspecified type scar from previous cesarean delivery: Secondary | ICD-10-CM

## 2018-12-17 DIAGNOSIS — O10913 Unspecified pre-existing hypertension complicating pregnancy, third trimester: Secondary | ICD-10-CM

## 2018-12-17 DIAGNOSIS — O0993 Supervision of high risk pregnancy, unspecified, third trimester: Secondary | ICD-10-CM

## 2018-12-17 DIAGNOSIS — Z3A34 34 weeks gestation of pregnancy: Secondary | ICD-10-CM

## 2018-12-17 DIAGNOSIS — O99323 Drug use complicating pregnancy, third trimester: Secondary | ICD-10-CM

## 2018-12-17 DIAGNOSIS — F1120 Opioid dependence, uncomplicated: Secondary | ICD-10-CM

## 2018-12-17 NOTE — Progress Notes (Signed)
   PRENATAL VISIT NOTE  Subjective:  [name] is a 33 y.o. G2P0101 at [redacted]w[redacted]d being seen today for ongoing prenatal care.  She is currently monitored for the following issues for this high-risk pregnancy and has POLYCYSTIC OVARIES; [place]; Essential hypertension; ALLERGIC RHINITIS; ASTHMA; BACK PAIN WITH RADICULOPATHY; HX, PERSONAL, TOBACCO USE; Supervision of high risk pregnancy, antepartum; Chronic hypertension in pregnancy; History of cesarean delivery, antepartum; and Methadone maintenance treatment affecting pregnancy, antepartum ([place]) on their problem list.  Patient reports no complaints.  Contractions: Irritability. Vag. Bleeding: None.  Movement: Present. Denies leaking of fluid.   The following portions of the patient's history were reviewed and updated as appropriate: allergies, current medications, past family history, past medical history, past social history, past surgical history and problem list.   Objective:   Vitals:   12/17/18 0934  BP: (!) 130/95  Pulse: 86  Temp: 98.4 F (36.9 C)    Fetal Status: Fetal Heart Rate (bpm): 134   Movement: Present     General:  Alert, oriented and cooperative. Patient is in no acute distress.  Skin: Skin is warm and dry. No rash noted.   Cardiovascular: Normal heart rate noted  Respiratory: Normal respiratory effort, no problems with respiration noted  Abdomen: Soft, gravid, appropriate for gestational age.  Pain/Pressure: Present     Pelvic: Cervical exam deferred        Extremities: Normal range of motion.     Mental Status: Normal mood and affect. Normal behavior. Normal judgment and thought content.   Assessment and Plan:  Pregnancy: G2P0101 at [redacted]w[redacted]d 1. Chronic hypertension in pregnancy Bp is well controlled on Labetalol and ASA Appropriately grown and BPP's weekly with MFM  2. Supervision of high risk pregnancy, antepartum Continue prenatal care.   3. History of cesarean delivery, antepartum For  RCS--scheduled with IUD placement  4. Methadone maintenance treatment affecting pregnancy, antepartum ([place]) Has recently increased her dose--she feels like she is still having withdrawal symptoms. To discuss with her care givers. NICU to call her.  Preterm labor symptoms and general obstetric precautions including but not limited to vaginal bleeding, contractions, leaking of fluid and fetal movement were reviewed in detail with the patient. Please refer to After Visit Summary for other counseling recommendations.   Return in 2 weeks (on 12/31/2018) for in person.  Future Appointments  Date Time Provider [place]  12/22/2018  9:45 AM [place] MFC-US  12/22/2018  9:45 AM WH-MFC [place] 2 WH-MFCUS MFC-US  12/29/2018  9:30 AM WH-MFC NURSE WH-MFC MFC-US  12/29/2018  9:30 AM WH-MFC [place] 1 WH-MFCUS MFC-US  01/05/2019  9:45 AM WH-MFC NURSE WH-MFC MFC-US  01/05/2019  9:45 AM WH-MFC [place] 2 WH-MFCUS MFC-US  01/05/2019 11:15 AM Donnamae Jude, MD WOC-WOCA WOC    Donnamae Jude, MD

## 2018-12-17 NOTE — Patient Instructions (Signed)

## 2018-12-20 ENCOUNTER — Encounter (HOSPITAL_COMMUNITY): Payer: Self-pay

## 2018-12-20 NOTE — Progress Notes (Unsigned)
NICU NAS consultation completed by Jiles Harold, NNP, by phone. Discussed Eat/Sleep/Console. Questions answered. Patient requested NAS brochure. Brochure e-mailed to Cathlean Marseilles, RN, to be provided at patient's next [facility] appointment.

## 2018-12-22 ENCOUNTER — Encounter (HOSPITAL_COMMUNITY): Payer: Self-pay

## 2018-12-22 ENCOUNTER — Other Ambulatory Visit: Payer: Self-pay

## 2018-12-22 ENCOUNTER — Ambulatory Visit (HOSPITAL_COMMUNITY)
Admission: RE | Admit: 2018-12-22 | Discharge: 2018-12-22 | Disposition: A | Discharge status: Home / Self Care | Payer: Medicaid Other | Source: Ambulatory Visit | Attending: Obstetrics and Gynecology | Admitting: Obstetrics and Gynecology

## 2018-12-22 ENCOUNTER — Ambulatory Visit (HOSPITAL_COMMUNITY): Payer: Medicaid Other | Admitting: *Deleted

## 2018-12-22 DIAGNOSIS — Z3A34 34 weeks gestation of pregnancy: Secondary | ICD-10-CM

## 2018-12-22 DIAGNOSIS — F1120 Opioid dependence, uncomplicated: Secondary | ICD-10-CM | POA: Diagnosis present

## 2018-12-22 DIAGNOSIS — O34219 Maternal care for unspecified type scar from previous cesarean delivery: Secondary | ICD-10-CM

## 2018-12-22 DIAGNOSIS — O0990 Supervision of high risk pregnancy, unspecified, unspecified trimester: Secondary | ICD-10-CM

## 2018-12-22 DIAGNOSIS — O99320 Drug use complicating pregnancy, unspecified trimester: Secondary | ICD-10-CM | POA: Insufficient documentation

## 2018-12-22 DIAGNOSIS — O09293 Supervision of pregnancy with other poor reproductive or obstetric history, third trimester: Secondary | ICD-10-CM

## 2018-12-22 DIAGNOSIS — O09213 Supervision of pregnancy with history of pre-term labor, third trimester: Secondary | ICD-10-CM | POA: Diagnosis not present

## 2018-12-22 DIAGNOSIS — O10013 Pre-existing essential hypertension complicating pregnancy, third trimester: Secondary | ICD-10-CM | POA: Diagnosis not present

## 2018-12-22 DIAGNOSIS — O99213 Obesity complicating pregnancy, third trimester: Secondary | ICD-10-CM

## 2018-12-22 DIAGNOSIS — O10919 Unspecified pre-existing hypertension complicating pregnancy, unspecified trimester: Secondary | ICD-10-CM | POA: Insufficient documentation

## 2018-12-22 DIAGNOSIS — F1910 Other psychoactive substance abuse, uncomplicated: Secondary | ICD-10-CM

## 2018-12-22 DIAGNOSIS — O99323 Drug use complicating pregnancy, third trimester: Secondary | ICD-10-CM

## 2018-12-29 ENCOUNTER — Encounter (HOSPITAL_COMMUNITY): Payer: Self-pay

## 2018-12-29 ENCOUNTER — Other Ambulatory Visit: Payer: Self-pay

## 2018-12-29 ENCOUNTER — Other Ambulatory Visit (HOSPITAL_COMMUNITY): Payer: Self-pay | Admitting: *Deleted

## 2018-12-29 ENCOUNTER — Other Ambulatory Visit (HOSPITAL_COMMUNITY): Payer: Self-pay | Admitting: Obstetrics and Gynecology

## 2018-12-29 ENCOUNTER — Ambulatory Visit (HOSPITAL_COMMUNITY): Payer: Medicaid Other | Admitting: *Deleted

## 2018-12-29 ENCOUNTER — Ambulatory Visit (HOSPITAL_COMMUNITY)
Admission: RE | Admit: 2018-12-29 | Discharge: 2018-12-29 | Disposition: A | Discharge status: Home / Self Care | Payer: Medicaid Other | Source: Ambulatory Visit | Attending: Obstetrics and Gynecology | Admitting: Obstetrics and Gynecology

## 2018-12-29 DIAGNOSIS — O99323 Drug use complicating pregnancy, third trimester: Secondary | ICD-10-CM

## 2018-12-29 DIAGNOSIS — O99213 Obesity complicating pregnancy, third trimester: Secondary | ICD-10-CM

## 2018-12-29 DIAGNOSIS — O34219 Maternal care for unspecified type scar from previous cesarean delivery: Secondary | ICD-10-CM | POA: Insufficient documentation

## 2018-12-29 DIAGNOSIS — O0990 Supervision of high risk pregnancy, unspecified, unspecified trimester: Secondary | ICD-10-CM

## 2018-12-29 DIAGNOSIS — F1120 Opioid dependence, uncomplicated: Secondary | ICD-10-CM

## 2018-12-29 DIAGNOSIS — O10919 Unspecified pre-existing hypertension complicating pregnancy, unspecified trimester: Secondary | ICD-10-CM

## 2018-12-29 DIAGNOSIS — F1910 Other psychoactive substance abuse, uncomplicated: Secondary | ICD-10-CM

## 2018-12-29 DIAGNOSIS — Z3A35 35 weeks gestation of pregnancy: Secondary | ICD-10-CM

## 2018-12-29 DIAGNOSIS — O10013 Pre-existing essential hypertension complicating pregnancy, third trimester: Secondary | ICD-10-CM | POA: Diagnosis not present

## 2018-12-29 DIAGNOSIS — O289 Unspecified abnormal findings on antenatal screening of mother: Secondary | ICD-10-CM

## 2018-12-29 DIAGNOSIS — O09293 Supervision of pregnancy with other poor reproductive or obstetric history, third trimester: Secondary | ICD-10-CM | POA: Diagnosis not present

## 2018-12-29 DIAGNOSIS — O99320 Drug use complicating pregnancy, unspecified trimester: Secondary | ICD-10-CM | POA: Insufficient documentation

## 2018-12-29 NOTE — Procedures (Signed)
[name] Dec 04, 1985 [redacted]w[redacted]d  Fetus A Non-Stress Test Interpretation for 12/29/18  Indication: Unsatisfactory BPP  Fetal Heart Rate A Mode: External Baseline Rate (A): 135 bpm Variability: Moderate Accelerations: 15 x 15 Decelerations: None Multiple birth?: No  Uterine Activity Mode: Palpation, Toco Contraction Frequency (min): 2-4 Contraction Duration (sec): 40-70 Contraction Quality: Mild Resting Tone Palpated: Relaxed Resting Time: Adequate  Interpretation (Fetal Testing) Nonstress Test Interpretation: Reactive Comments: Reviewed tracing with Dr. Gertie Exon

## 2019-01-04 ENCOUNTER — Telehealth: Payer: Self-pay | Admitting: Family Medicine

## 2019-01-04 NOTE — Telephone Encounter (Signed)
Spoke to patient about her appointment on 8/26 @ 11:15. Patient instructed to wear a face mask for the entire appointment and no visitors are allowed with her during the visit. Patient screened for covid symptoms and denied having any °

## 2019-01-05 ENCOUNTER — Other Ambulatory Visit: Payer: Self-pay

## 2019-01-05 ENCOUNTER — Encounter: Payer: Self-pay | Admitting: Family Medicine

## 2019-01-05 ENCOUNTER — Ambulatory Visit (HOSPITAL_COMMUNITY)
Admission: RE | Admit: 2019-01-05 | Discharge: 2019-01-05 | Disposition: A | Discharge status: Home / Self Care | Payer: Medicaid Other | Source: Ambulatory Visit | Attending: Obstetrics and Gynecology | Admitting: Obstetrics and Gynecology

## 2019-01-05 ENCOUNTER — Inpatient Hospital Stay (HOSPITAL_COMMUNITY)
Admission: AD | Admit: 2019-01-05 | Discharge: 2019-01-09 | DRG: 787 | Disposition: A | Discharge status: Home / Self Care | Payer: Medicaid Other | Attending: Obstetrics and Gynecology | Admitting: Obstetrics and Gynecology

## 2019-01-05 ENCOUNTER — Telehealth (HOSPITAL_COMMUNITY): Payer: Self-pay | Admitting: *Deleted

## 2019-01-05 ENCOUNTER — Encounter (HOSPITAL_COMMUNITY): Payer: Self-pay

## 2019-01-05 ENCOUNTER — Inpatient Hospital Stay (HOSPITAL_COMMUNITY): Payer: Medicaid Other | Admitting: Certified Registered Nurse Anesthetist

## 2019-01-05 ENCOUNTER — Ambulatory Visit (HOSPITAL_COMMUNITY): Payer: Medicaid Other | Admitting: *Deleted

## 2019-01-05 ENCOUNTER — Other Ambulatory Visit (HOSPITAL_COMMUNITY): Payer: Self-pay | Admitting: Obstetrics and Gynecology

## 2019-01-05 ENCOUNTER — Encounter (HOSPITAL_COMMUNITY)
Admission: AD | Disposition: A | Discharge status: Home / Self Care | Payer: Self-pay | Source: Home / Self Care | Attending: Obstetrics and Gynecology

## 2019-01-05 ENCOUNTER — Encounter: Payer: Medicaid Other | Admitting: Family Medicine

## 2019-01-05 DIAGNOSIS — O99324 Drug use complicating childbirth: Secondary | ICD-10-CM | POA: Diagnosis not present

## 2019-01-05 DIAGNOSIS — Z20828 Contact with and (suspected) exposure to other viral communicable diseases: Secondary | ICD-10-CM | POA: Diagnosis not present

## 2019-01-05 DIAGNOSIS — Z362 Encounter for other antenatal screening follow-up: Secondary | ICD-10-CM | POA: Diagnosis not present

## 2019-01-05 DIAGNOSIS — O34219 Maternal care for unspecified type scar from previous cesarean delivery: Secondary | ICD-10-CM

## 2019-01-05 DIAGNOSIS — O34211 Maternal care for low transverse scar from previous cesarean delivery: Secondary | ICD-10-CM | POA: Diagnosis present

## 2019-01-05 DIAGNOSIS — O99214 Obesity complicating childbirth: Secondary | ICD-10-CM | POA: Diagnosis present

## 2019-01-05 DIAGNOSIS — O99334 Smoking (tobacco) complicating childbirth: Secondary | ICD-10-CM | POA: Diagnosis present

## 2019-01-05 DIAGNOSIS — O1002 Pre-existing essential hypertension complicating childbirth: Secondary | ICD-10-CM | POA: Diagnosis not present

## 2019-01-05 DIAGNOSIS — O10919 Unspecified pre-existing hypertension complicating pregnancy, unspecified trimester: Secondary | ICD-10-CM

## 2019-01-05 DIAGNOSIS — Z98891 History of uterine scar from previous surgery: Secondary | ICD-10-CM

## 2019-01-05 DIAGNOSIS — F1910 Other psychoactive substance abuse, uncomplicated: Secondary | ICD-10-CM | POA: Diagnosis not present

## 2019-01-05 DIAGNOSIS — F17210 Nicotine dependence, cigarettes, uncomplicated: Secondary | ICD-10-CM | POA: Diagnosis present

## 2019-01-05 DIAGNOSIS — O09213 Supervision of pregnancy with history of pre-term labor, third trimester: Secondary | ICD-10-CM

## 2019-01-05 DIAGNOSIS — F1190 Opioid use, unspecified, uncomplicated: Secondary | ICD-10-CM | POA: Diagnosis present

## 2019-01-05 DIAGNOSIS — O99320 Drug use complicating pregnancy, unspecified trimester: Secondary | ICD-10-CM | POA: Insufficient documentation

## 2019-01-05 DIAGNOSIS — Z30430 Encounter for insertion of intrauterine contraceptive device: Secondary | ICD-10-CM

## 2019-01-05 DIAGNOSIS — F1120 Opioid dependence, uncomplicated: Secondary | ICD-10-CM

## 2019-01-05 DIAGNOSIS — O10013 Pre-existing essential hypertension complicating pregnancy, third trimester: Secondary | ICD-10-CM

## 2019-01-05 DIAGNOSIS — O289 Unspecified abnormal findings on antenatal screening of mother: Secondary | ICD-10-CM | POA: Diagnosis not present

## 2019-01-05 DIAGNOSIS — Z7982 Long term (current) use of aspirin: Secondary | ICD-10-CM | POA: Diagnosis not present

## 2019-01-05 DIAGNOSIS — Z3A36 36 weeks gestation of pregnancy: Secondary | ICD-10-CM | POA: Diagnosis not present

## 2019-01-05 DIAGNOSIS — O99213 Obesity complicating pregnancy, third trimester: Secondary | ICD-10-CM | POA: Diagnosis not present

## 2019-01-05 DIAGNOSIS — O09293 Supervision of pregnancy with other poor reproductive or obstetric history, third trimester: Secondary | ICD-10-CM

## 2019-01-05 DIAGNOSIS — O0990 Supervision of high risk pregnancy, unspecified, unspecified trimester: Secondary | ICD-10-CM | POA: Insufficient documentation

## 2019-01-05 DIAGNOSIS — O365930 Maternal care for other known or suspected poor fetal growth, third trimester, not applicable or unspecified: Secondary | ICD-10-CM | POA: Diagnosis not present

## 2019-01-05 DIAGNOSIS — O99323 Drug use complicating pregnancy, third trimester: Secondary | ICD-10-CM

## 2019-01-05 DIAGNOSIS — Z975 Presence of (intrauterine) contraceptive device: Secondary | ICD-10-CM

## 2019-01-05 LAB — TYPE AND SCREEN
ABO/RH(D): O POS
Antibody Screen: NEGATIVE

## 2019-01-05 LAB — CBC
HCT: 37.1 % (ref 36.0–46.0)
Hemoglobin: 12.4 g/dL (ref 12.0–15.0)
MCH: 30.9 pg (ref 26.0–34.0)
MCHC: 33.4 g/dL (ref 30.0–36.0)
MCV: 92.5 fL (ref 80.0–100.0)
Platelets: 264 10*3/uL (ref 150–400)
RBC: 4.01 MIL/uL (ref 3.87–5.11)
RDW: 12.7 % (ref 11.5–15.5)
WBC: 13.5 10*3/uL — ABNORMAL HIGH (ref 4.0–10.5)
nRBC: 0 % (ref 0.0–0.2)

## 2019-01-05 LAB — URINALYSIS, ROUTINE W REFLEX MICROSCOPIC
Bilirubin Urine: NEGATIVE
Glucose, UA: NEGATIVE mg/dL
Hgb urine dipstick: NEGATIVE
Ketones, ur: 5 mg/dL — AB
Nitrite: NEGATIVE
Protein, ur: 30 mg/dL — AB
Specific Gravity, Urine: 1.019 (ref 1.005–1.030)
pH: 5 (ref 5.0–8.0)

## 2019-01-05 LAB — ABO/RH: ABO/RH(D): O POS

## 2019-01-05 LAB — SARS CORONAVIRUS 2 (TAT 6-24 HRS): SARS Coronavirus 2: NEGATIVE

## 2019-01-05 SURGERY — Surgical Case
Anesthesia: Spinal | Site: Abdomen | Wound class: Clean Contaminated

## 2019-01-05 MED ORDER — LACTATED RINGERS IV SOLN
INTRAVENOUS | Status: DC
  Administered 2019-01-05: 17:00:00 via INTRAVENOUS

## 2019-01-05 MED ORDER — ONDANSETRON HCL 4 MG/2ML IJ SOLN
INTRAMUSCULAR | Status: AC
  Filled 2019-01-05: qty 2

## 2019-01-05 MED ORDER — SOD CITRATE-CITRIC ACID 500-334 MG/5ML PO SOLN
30.0000 mL | Freq: Once | ORAL | Status: AC
  Administered 2019-01-05: 30 mL via ORAL
  Filled 2019-01-05: qty 30

## 2019-01-05 MED ORDER — LEVONORGESTREL 19.5 MCG/DAY IU IUD
INTRAUTERINE_SYSTEM | Freq: Once | INTRAUTERINE | Status: AC
  Administered 2019-01-06: via INTRAUTERINE

## 2019-01-05 MED ORDER — SODIUM CHLORIDE 0.9 % IV SOLN
INTRAVENOUS | Status: DC | PRN
  Administered 2019-01-05: 30 ug/min via INTRAVENOUS

## 2019-01-05 MED ORDER — LACTATED RINGERS IV SOLN
INTRAVENOUS | Status: DC
  Administered 2019-01-05 (×3): via INTRAVENOUS

## 2019-01-05 MED ORDER — LEVONORGESTREL 19.5 MCG/DAY IU IUD
INTRAUTERINE_SYSTEM | INTRAUTERINE | Status: AC
  Filled 2019-01-05: qty 1

## 2019-01-05 MED ORDER — SODIUM CHLORIDE 0.9 % IV SOLN
2.0000 g | INTRAVENOUS | Status: DC
  Filled 2019-01-05: qty 2

## 2019-01-05 MED ORDER — MORPHINE SULFATE (PF) 0.5 MG/ML IJ SOLN
INTRAMUSCULAR | Status: AC
  Filled 2019-01-05: qty 10

## 2019-01-05 MED ORDER — FENTANYL CITRATE (PF) 100 MCG/2ML IJ SOLN
INTRAMUSCULAR | Status: AC
  Filled 2019-01-05: qty 2

## 2019-01-05 MED ORDER — FAMOTIDINE IN NACL 20-0.9 MG/50ML-% IV SOLN
20.0000 mg | Freq: Once | INTRAVENOUS | Status: AC
  Administered 2019-01-05: 20 mg via INTRAVENOUS
  Filled 2019-01-05: qty 50

## 2019-01-05 MED ORDER — SOD CITRATE-CITRIC ACID 500-334 MG/5ML PO SOLN
30.0000 mL | ORAL | Status: AC
  Administered 2019-01-05: 30 mL via ORAL
  Filled 2019-01-05: qty 30

## 2019-01-05 MED ORDER — ACETAMINOPHEN 500 MG PO TABS
1000.0000 mg | ORAL_TABLET | ORAL | Status: AC
  Administered 2019-01-05: 1000 mg via ORAL
  Filled 2019-01-05: qty 2

## 2019-01-05 MED ORDER — CEFAZOLIN SODIUM-DEXTROSE 2-3 GM-%(50ML) IV SOLR
INTRAVENOUS | Status: DC | PRN
  Administered 2019-01-05: 2 g via INTRAVENOUS

## 2019-01-05 MED ORDER — PHENYLEPHRINE HCL-NACL 20-0.9 MG/250ML-% IV SOLN
INTRAVENOUS | Status: AC
  Filled 2019-01-05: qty 250

## 2019-01-05 MED ORDER — OXYTOCIN 40 UNITS IN NORMAL SALINE INFUSION - SIMPLE MED
INTRAVENOUS | Status: AC
  Filled 2019-01-05: qty 1000

## 2019-01-05 MED ORDER — BUPIVACAINE HCL (PF) 0.5 % IJ SOLN
INTRAMUSCULAR | Status: AC
  Filled 2019-01-05: qty 30

## 2019-01-05 MED ORDER — DEXAMETHASONE SODIUM PHOSPHATE 10 MG/ML IJ SOLN
INTRAMUSCULAR | Status: AC
  Filled 2019-01-05: qty 1

## 2019-01-05 SURGICAL SUPPLY — 37 items
APL SKNCLS STERI-STRIP NONHPOA (GAUZE/BANDAGES/DRESSINGS) ×1
BENZOIN TINCTURE PRP APPL 2/3 (GAUZE/BANDAGES/DRESSINGS) ×1 IMPLANT
BRR ADH 6X5 SEPRAFILM 1 SHT (MISCELLANEOUS)
CHLORAPREP W/TINT 26ML (MISCELLANEOUS) ×2 IMPLANT
CLAMP CORD UMBIL (MISCELLANEOUS) IMPLANT
DRESSING PREVENA PLUS CUSTOM (GAUZE/BANDAGES/DRESSINGS) IMPLANT
DRSG OPSITE POSTOP 4X10 (GAUZE/BANDAGES/DRESSINGS) ×2 IMPLANT
DRSG PREVENA PLUS CUSTOM (GAUZE/BANDAGES/DRESSINGS) ×2
ELECT REM PT RETURN 9FT ADLT (ELECTROSURGICAL) ×2
ELECTRODE REM PT RTRN 9FT ADLT (ELECTROSURGICAL) ×1 IMPLANT
EXTRACTOR VACUUM KIWI (MISCELLANEOUS) ×1 IMPLANT
EXTRACTOR VACUUM M CUP 4 TUBE (SUCTIONS) IMPLANT
GLOVE BIOGEL PI IND STRL 6.5 (GLOVE) ×1 IMPLANT
GLOVE BIOGEL PI IND STRL 7.0 (GLOVE) ×1 IMPLANT
GLOVE BIOGEL PI INDICATOR 6.5 (GLOVE) ×1
GLOVE BIOGEL PI INDICATOR 7.0 (GLOVE) ×1
GLOVE SURG SS PI 6.0 STRL IVOR (GLOVE) ×2 IMPLANT
GOWN STRL REUS W/TWL LRG LVL3 (GOWN DISPOSABLE) ×4 IMPLANT
HEMOSTAT ARISTA ABSORB 3G PWDR (HEMOSTASIS) ×1 IMPLANT
KIT ABG SYR 3ML LUER SLIP (SYRINGE) IMPLANT
NDL HYPO 25X5/8 SAFETYGLIDE (NEEDLE) IMPLANT
NEEDLE HYPO 25X5/8 SAFETYGLIDE (NEEDLE) IMPLANT
NS IRRIG 1000ML POUR BTL (IV SOLUTION) ×2 IMPLANT
PACK C SECTION WH (CUSTOM PROCEDURE TRAY) ×2 IMPLANT
PAD OB MATERNITY 4.3X12.25 (PERSONAL CARE ITEMS) ×2 IMPLANT
PENCIL SMOKE EVAC W/HOLSTER (ELECTROSURGICAL) ×2 IMPLANT
RTRCTR C-SECT PINK 25CM LRG (MISCELLANEOUS) IMPLANT
SEPRAFILM MEMBRANE 5X6 (MISCELLANEOUS) IMPLANT
STRIP CLOSURE SKIN 1/2X4 (GAUZE/BANDAGES/DRESSINGS) ×1 IMPLANT
SUT PLAIN 0 NONE (SUTURE) IMPLANT
SUT PLAIN 2 0 XLH (SUTURE) ×2 IMPLANT
SUT VIC AB 0 CT1 36 (SUTURE) ×8 IMPLANT
SUT VIC AB 2-0 CTX 36 (SUTURE) ×1 IMPLANT
SUT VIC AB 4-0 KS 27 (SUTURE) ×2 IMPLANT
TOWEL OR 17X24 6PK STRL BLUE (TOWEL DISPOSABLE) ×2 IMPLANT
TRAY FOLEY W/BAG SLVR 14FR LF (SET/KITS/TRAYS/PACK) ×2 IMPLANT
WATER STERILE IRR 1000ML POUR (IV SOLUTION) ×3 IMPLANT

## 2019-01-05 NOTE — MAU Note (Addendum)
Pt is [redacted]w[redacted]D, G2P1.  Pt sent from MFM for evaluation due to BPP 6/10. Per Dr Kennon Rounds patient is going to be scheduled for C-section today. Patient reports decreased fetal movement.  Denies LOF/VB. Feeling only mild Braxton-Hicks contractions.

## 2019-01-05 NOTE — H&P (Addendum)
Obstetric Preoperative History and Physical  [name] is a 33 y.o. G2P0101 with IUP at [redacted]w[redacted]d presenting for presenting for scheduled cesarean section.  No acute concerns.  Prenatal Course Source of Care: [facility]  with onset of care at 16 weeks Pregnancy complications or risks: Patient Active Problem List   Diagnosis Date Noted  . Supervision of high risk pregnancy, antepartum 07/23/2018  . Chronic hypertension in pregnancy 07/23/2018  . History of cesarean delivery, antepartum 07/23/2018  . Methadone maintenance treatment affecting pregnancy, antepartum (HCC) 07/23/2018  . ALLERGIC RHINITIS 04/01/2007  . BACK PAIN WITH RADICULOPATHY 04/01/2007  . POLYCYSTIC OVARIES 02/02/2007  . BIPOLAR AFFECTIVE DISORDER 02/02/2007  . Essential hypertension 02/02/2007  . ASTHMA 02/02/2007  . HX, PERSONAL, TOBACCO USE 02/02/2007   She plans to breastfeed She desires IUD for postpartum contraception.   Prenatal labs and studies: ABO, Rh: --/--/O POS (08/26 1442) Antibody: NEG (08/26 1442) Rubella: 1.36 (03/13 1209) RPR: Non Reactive (06/25 0848)  HBsAg: Negative (03/13 1209)  HIV: Non Reactive (06/25 0848)  GBS:  none on file 2 hr Glucola  normal Genetic screening NIPS low risk Anatomy US normal  Prenatal Transfer Tool  Maternal Diabetes: No Genetic Screening: Normal Maternal Ultrasounds/Referrals: IUGR Fetal Ultrasounds or other Referrals:  None Maternal Substance Abuse:  Yes:  Type: Methadone Significant Maternal Medications:  Meds include: Other: Methadone, labetalol Significant Maternal Lab Results: None  Past Medical History:  Diagnosis Date  . Chronic back pain   . Chronic knee pain   . Drug abuse (HCC)   . Hypertension   . Long-term current use of methadone for opiate dependence (HCC)   . PCOS (polycystic ovarian syndrome)   . Scoliosis     Past Surgical History:  Procedure Laterality Date  . CESAREAN SECTION    . GANGLION CYST EXCISION    . KNEE SURGERY    .  MULTIPLE TOOTH EXTRACTIONS     Pt has full upper and lower dentures    OB History  Gravida Para Term Preterm AB Living  2 1   1   1   SAB TAB Ectopic Multiple Live Births          1    # Outcome Date GA Lbr Len/2nd Weight Sex Delivery Anes PTL Lv  2 Current           1 Preterm 2012 [redacted]w[redacted]d  1417 g F CS-Unspec Spinal N LIV    Social History   Socioeconomic History  . Marital status: Single    Spouse name: Not on file  . Number of children: Not on file  . Years of education: Not on file  . Highest education level: Not on file  Occupational History    Comment: [employer]  Social Needs  . Financial resource strain: Not on file  . Food insecurity    Worry: Sometimes true    Inability: Never true  . Transportation needs    Medical: No    Non-medical: No  Tobacco Use  . Smoking status: Current Every Day Smoker    Packs/day: 0.25    Types: Cigarettes  . Smokeless tobacco: Never Used  Substance and Sexual Activity  . Alcohol use: Not Currently  . Drug use: Yes    Types: Oxycodone, Cocaine, Marijuana    Comment: oxycontin, opana, neurontin stopped 2011, on methadone now  . Sexual activity: Yes    Birth control/protection: None  Lifestyle  . Physical activity    Days per week: Not on  file    Minutes per session: Not on file  . Stress: Not on file  Relationships  . Social Musicianconnections    Talks on phone: Not on file    Gets together: Not on file    Attends religious service: Not on file    Active member of club or organization: Not on file    Attends meetings of clubs or organizations: Not on file    Relationship status: Not on file  Other Topics Concern  . Not on file  Social History Narrative  . Not on file    Family History  Problem Relation Age of Onset  . Diabetes Mother   . Heart disease Mother   . Hypertension Mother   . Varicose Veins Mother   . Arthritis Mother   . Anxiety disorder Mother   . Depression Mother   . Early death Mother   .  Alcohol abuse Father   . Hypertension Father   . Stroke Father   . Anxiety disorder Father   . Depression Father   . Drug abuse Sister   . Alcohol abuse Sister     Medications Prior to Admission  Medication Sig Dispense Refill Last Dose  . aspirin EC 81 MG tablet Take 1 tablet (81 mg total) by mouth daily. Take after 12 weeks for prevention of preeclampsia later in pregnancy 300 tablet 2 01/05/2019 at Unknown time  . labetalol (NORMODYNE) 200 MG tablet Take 1 tablet (200 mg total) by mouth 2 (two) times daily. 60 tablet 4 01/05/2019 at Unknown time  . methadone (DOLOPHINE) 0.4 mg/mL SOLN Take 105 mg by mouth daily.    01/05/2019 at Unknown time  . prenatal vitamin w/FE, FA (PRENATAL 1 + 1) 27-1 MG TABS tablet Take 1 tablet by mouth daily at 12 noon. 30 each 11 01/05/2019 at Unknown time    No Known Allergies  Review of Systems: Negative except for what is mentioned in HPI.  Physical Exam: BP (!) 155/83 (BP Location: Right Arm)   Pulse 72   Temp 98.1 F (36.7 C) (Oral)   Resp 17   Ht 5\' 3"  (1.6 m)   Wt 109.3 kg   SpO2 100% Comment: room air  BMI 42.69 kg/m    CONSTITUTIONAL: Well-developed, well-nourished female in no acute distress. Obese EYES: Conjunctivae and EOM are normal. Pupils are equal, round, and reactive to light. No scleral icterus.  NECK: Normal range of motion, supple SKIN: Skin is warm and dry. No rash noted. Not diaphoretic. No erythema. No pallor. NEUROLGIC: Alert and oriented to person, place, and time. Normal reflexes, muscle tone coordination. No cranial nerve deficit noted. PSYCHIATRIC: Normal mood and affect. Normal behavior. Normal judgment and thought content. CARDIOVASCULAR: Normal heart rate noted, regular rhythm RESPIRATORY: Effort and breath sounds normal, no problems with respiration noted ABDOMEN: Soft, nontender, nondistended, gravid PELVIC: Deferred MUSCULOSKELETAL: Normal range of motion. No edema and no tenderness.   FHT: baseline 125,  moderate variability, +accels, no decels, no ctx on toco  Pertinent Labs/Studies:   Results for orders placed or performed during the hospital encounter of 01/05/19 (from the past 72 hour(s))  Urinalysis, Routine w reflex microscopic     Status: Abnormal   Collection Time: 01/05/19  2:28 PM  Result Value Ref Range   Color, Urine AMBER (A) YELLOW    Comment: BIOCHEMICALS MAY BE AFFECTED BY COLOR   APPearance CLEAR CLEAR   Specific Gravity, Urine 1.019 1.005 - 1.030   pH 5.0 5.0 -  8.0   Glucose, UA NEGATIVE NEGATIVE mg/dL   Hgb urine dipstick NEGATIVE NEGATIVE   Bilirubin Urine NEGATIVE NEGATIVE   Ketones, ur 5 (A) NEGATIVE mg/dL   Protein, ur 30 (A) NEGATIVE mg/dL   Nitrite NEGATIVE NEGATIVE   Leukocytes,Ua TRACE (A) NEGATIVE   RBC / HPF 0-5 0 - 5 RBC/hpf   WBC, UA 0-5 0 - 5 WBC/hpf   Bacteria, UA RARE (A) NONE SEEN   Squamous Epithelial / LPF 6-10 0 - 5   Mucus PRESENT     Comment: Performed at [facility], [place] [address], [place], [place] [address]  CBC     Status: Abnormal   Collection Time: 01/05/19  2:39 PM  Result Value Ref Range   WBC 13.5 (H) 4.0 - 10.5 K/uL   RBC 4.01 3.87 - 5.11 MIL/uL   Hemoglobin 12.4 12.0 - 15.0 g/dL   HCT 37.1 36.0 - 46.0 %   MCV 92.5 80.0 - 100.0 fL   MCH 30.9 26.0 - 34.0 pg   MCHC 33.4 30.0 - 36.0 g/dL   RDW 12.7 11.5 - 15.5 %   Platelets 264 150 - 400 K/uL   nRBC 0.0 0.0 - 0.2 %    Comment: Performed at [facility], [place] [address], [place], [place] [address]  Type and screen     Status: None   Collection Time: 01/05/19  2:42 PM  Result Value Ref Range   ABO/RH(D) O POS    Antibody Screen NEG    Sample Expiration      01/08/2019,2359 Performed at [facility], [place] [address], [place], [place] [address]     Assessment and Plan :[name] is a 33 y.o. G2P0101 at [redacted]w[redacted]d being admitted being admitted for scheduled cesarean section due to IUGR with BPP 6/10 today and maternal request for repeat cesarean.    #Repeat Cesarean The risks of cesarean section discussed with the patient included but were not limited to: bleeding which may require transfusion or reoperation; infection which may require antibiotics; injury to bowel, bladder, ureters or other surrounding organs; injury to the fetus; need for additional procedures including hysterectomy in the event of a life-threatening hemorrhage; placental abnormalities wth subsequent pregnancies, incisional problems, thromboembolic phenomenon and other postoperative/anesthesia complications. The patient concurred with the proposed plan, giving informed written consent for the procedure. Patient has been NPO since last night she will remain NPO for procedure. Anesthesia and OR aware. Preoperative prophylactic antibiotics and SCDs ordered on call to the OR. To OR when ready.   #OUD Long term stability on methadone 105mg  daily, to resume tomorrow AM.  #cHTN On Labetalol 200mg  BID, monitor BP's post partum.   Clarnce Flock MD/[facility] OB Fellow

## 2019-01-05 NOTE — Procedures (Signed)
[name] 1985/05/26 [redacted]w[redacted]d  Fetus A Non-Stress Test Interpretation for 01/05/19  Indication: Chronic Hypertenstion  Fetal Heart Rate A Mode: External Baseline Rate (A): 130 bpm Variability: Moderate Accelerations: 10 x 10 Decelerations: None Multiple birth?: No  Uterine Activity Mode: Palpation, Toco Contraction Frequency (min): none Resting Tone Palpated: Relaxed Resting Time: Adequate  Interpretation (Fetal Testing) Nonstress Test Interpretation: Non-reactive Comments: Reviewed tracing with Dr. Theone Stanley

## 2019-01-05 NOTE — Progress Notes (Signed)
Pt sent to [facility] per Dr. Theone Stanley for BPP 6/10.  Pt to be repeat C/S.

## 2019-01-05 NOTE — Telephone Encounter (Signed)
Preadmission screen  

## 2019-01-05 NOTE — Patient Instructions (Signed)
[name]  01/05/2019   Your procedure is scheduled on:  01/20/2019  Arrive at 53 at Entrance C on [address] at [facility]  and [employer]. You are invited to use the FREE valet parking or use the Visitor's parking deck.  Pick up the phone at the desk and dial [phone].  Call this number if you have problems the morning of surgery: [phone]  Remember:   Do not eat food:(After Midnight) Desps de medianoche.  Do not drink clear liquids: (After Midnight) Desps de medianoche.  Take these medicines the morning of surgery with A SIP OF WATER:  Take labetalol and methadone as directed   Do not wear jewelry, make-up or nail polish.  Do not wear lotions, powders, or perfumes. Do not wear deodorant.  Do not shave 48 hours prior to surgery.  Do not bring valuables to the hospital.  [facility] is not   responsible for any belongings or valuables brought to the hospital.  Contacts, dentures or bridgework may not be worn into surgery.  Leave suitcase in the car. After surgery it may be brought to your room.  For patients admitted to the hospital, checkout time is 11:00 AM the day of              discharge.      Please read over the following fact sheets that you were given:     Preparing for Surgery

## 2019-01-05 NOTE — Progress Notes (Signed)
Did not keep appointment due to abnl fetal testing and referred for delivery.

## 2019-01-06 DIAGNOSIS — Z3A36 36 weeks gestation of pregnancy: Secondary | ICD-10-CM

## 2019-01-06 DIAGNOSIS — O34211 Maternal care for low transverse scar from previous cesarean delivery: Secondary | ICD-10-CM

## 2019-01-06 DIAGNOSIS — O365930 Maternal care for other known or suspected poor fetal growth, third trimester, not applicable or unspecified: Secondary | ICD-10-CM

## 2019-01-06 DIAGNOSIS — Z30430 Encounter for insertion of intrauterine contraceptive device: Secondary | ICD-10-CM

## 2019-01-06 LAB — COMPREHENSIVE METABOLIC PANEL
ALT: 17 U/L (ref 0–44)
ALT: 20 U/L (ref 0–44)
AST: 23 U/L (ref 15–41)
AST: 28 U/L (ref 15–41)
Albumin: 2.1 g/dL — ABNORMAL LOW (ref 3.5–5.0)
Albumin: 2.3 g/dL — ABNORMAL LOW (ref 3.5–5.0)
Alkaline Phosphatase: 126 U/L (ref 38–126)
Alkaline Phosphatase: 128 U/L — ABNORMAL HIGH (ref 38–126)
Anion gap: 9 (ref 5–15)
Anion gap: 9 (ref 5–15)
BUN: 9 mg/dL (ref 6–20)
BUN: 11 mg/dL (ref 6–20)
CO2: 23 mmol/L (ref 22–32)
CO2: 23 mmol/L (ref 22–32)
Calcium: 8.6 mg/dL — ABNORMAL LOW (ref 8.9–10.3)
Calcium: 9.1 mg/dL (ref 8.9–10.3)
Chloride: 106 mmol/L (ref 98–111)
Chloride: 103 mmol/L (ref 98–111)
Creatinine, Ser: 0.57 mg/dL (ref 0.44–1.00)
Creatinine, Ser: 0.57 mg/dL (ref 0.44–1.00)
GFR calc Af Amer: >60 mL/min (ref >60)
GFR calc Af Amer: >60 mL/min (ref >60)
GFR calc non Af Amer: >60 mL/min (ref >60)
GFR calc non Af Amer: >60 mL/min (ref >60)
Glucose, Bld: 141 mg/dL — ABNORMAL HIGH (ref 70–99)
Glucose, Bld: 79 mg/dL (ref 70–99)
Potassium: 3.9 mmol/L (ref 3.5–5.1)
Potassium: 4.2 mmol/L (ref 3.5–5.1)
Sodium: 138 mmol/L (ref 135–145)
Sodium: 135 mmol/L (ref 135–145)
Total Bilirubin: 0.6 mg/dL (ref 0.3–1.2)
Total Bilirubin: 0.4 mg/dL (ref 0.3–1.2)
Total Protein: 5.2 g/dL — ABNORMAL LOW (ref 6.5–8.1)
Total Protein: 5.3 g/dL — ABNORMAL LOW (ref 6.5–8.1)

## 2019-01-06 LAB — CBC
HCT: 36.4 % (ref 36.0–46.0)
HCT: 32 % — ABNORMAL LOW (ref 36.0–46.0)
Hemoglobin: 12.1 g/dL (ref 12.0–15.0)
Hemoglobin: 10.9 g/dL — ABNORMAL LOW (ref 12.0–15.0)
MCH: 30.9 pg (ref 26.0–34.0)
MCH: 31.2 pg (ref 26.0–34.0)
MCHC: 33.2 g/dL (ref 30.0–36.0)
MCHC: 34.1 g/dL (ref 30.0–36.0)
MCV: 92.9 fL (ref 80.0–100.0)
MCV: 91.7 fL (ref 80.0–100.0)
Platelets: 235 10*3/uL (ref 150–400)
Platelets: 238 10*3/uL (ref 150–400)
RBC: 3.92 MIL/uL (ref 3.87–5.11)
RBC: 3.49 MIL/uL — ABNORMAL LOW (ref 3.87–5.11)
RDW: 12.8 % (ref 11.5–15.5)
RDW: 13 % (ref 11.5–15.5)
WBC: 15.3 10*3/uL — ABNORMAL HIGH (ref 4.0–10.5)
WBC: 16.5 10*3/uL — ABNORMAL HIGH (ref 4.0–10.5)
nRBC: 0 % (ref 0.0–0.2)
nRBC: 0 % (ref 0.0–0.2)

## 2019-01-06 LAB — CREATININE, SERUM
Creatinine, Ser: 0.64 mg/dL (ref 0.44–1.00)
GFR calc Af Amer: >60 mL/min (ref >60)
GFR calc non Af Amer: >60 mL/min (ref >60)

## 2019-01-06 LAB — PROTEIN / CREATININE RATIO, URINE
Creatinine, Urine: 466.81 mg/dL
Protein Creatinine Ratio: 0.06 mg/mg{Cre} (ref 0.00–0.15)
Total Protein, Urine: 30 mg/dL

## 2019-01-06 LAB — RPR: RPR Ser Ql: NONREACTIVE

## 2019-01-06 MED ORDER — LACTATED RINGERS IV BOLUS
500.0000 mL | Freq: Once | INTRAVENOUS | Status: AC
  Administered 2019-01-06: 500 mL via INTRAVENOUS

## 2019-01-06 MED ORDER — BUPIVACAINE IN DEXTROSE 0.75-8.25 % IT SOLN
INTRATHECAL | Status: DC | PRN
  Administered 2019-01-05: 1.6 mL via INTRATHECAL

## 2019-01-06 MED ORDER — METHADONE HCL 10 MG/ML PO CONC: 105.0000 mg | Freq: Every day | ORAL | Status: DC

## 2019-01-06 MED ORDER — MEPERIDINE HCL 25 MG/ML IJ SOLN: 6.2500 mg | INTRAMUSCULAR | Status: DC | PRN

## 2019-01-06 MED ORDER — MORPHINE SULFATE (PF) 0.5 MG/ML IJ SOLN
INTRAMUSCULAR | Status: DC | PRN
  Administered 2019-01-05: .15 mg via INTRATHECAL

## 2019-01-06 MED ORDER — DIPHENHYDRAMINE HCL 25 MG PO CAPS: 25.0000 mg | ORAL_CAPSULE | ORAL | Status: DC | PRN

## 2019-01-06 MED ORDER — LOSARTAN POTASSIUM 50 MG PO TABS
100.0000 mg | ORAL_TABLET | Freq: Every day | ORAL | Status: DC
  Administered 2019-01-06 – 2019-01-07 (×2): 100 mg via ORAL
  Filled 2019-01-06 (×3): qty 2

## 2019-01-06 MED ORDER — METHADONE HCL 5 MG PO TABS
105.0000 mg | ORAL_TABLET | Freq: Every day | ORAL | Status: DC
  Administered 2019-01-06: 105 mg via ORAL
  Filled 2019-01-06: qty 1

## 2019-01-06 MED ORDER — NALBUPHINE HCL 10 MG/ML IJ SOLN: 5.0000 mg | Freq: Once | INTRAMUSCULAR | Status: DC | PRN

## 2019-01-06 MED ORDER — ZOLPIDEM TARTRATE 5 MG PO TABS: 5.0000 mg | ORAL_TABLET | Freq: Every evening | ORAL | Status: DC | PRN

## 2019-01-06 MED ORDER — NALOXONE HCL 0.4 MG/ML IJ SOLN: 0.4000 mg | INTRAMUSCULAR | Status: DC | PRN

## 2019-01-06 MED ORDER — HYDROCHLOROTHIAZIDE 25 MG PO TABS
25.0000 mg | ORAL_TABLET | Freq: Every day | ORAL | Status: DC
  Administered 2019-01-06 – 2019-01-07 (×2): 25 mg via ORAL
  Filled 2019-01-06 (×4): qty 1

## 2019-01-06 MED ORDER — KETOROLAC TROMETHAMINE 30 MG/ML IJ SOLN
INTRAMUSCULAR | Status: AC
  Filled 2019-01-06: qty 1

## 2019-01-06 MED ORDER — NALBUPHINE HCL 10 MG/ML IJ SOLN: 5.0000 mg | INTRAMUSCULAR | Status: DC | PRN

## 2019-01-06 MED ORDER — PRENATAL MULTIVITAMIN CH
1.0000 | ORAL_TABLET | Freq: Every day | ORAL | Status: DC
  Administered 2019-01-06 – 2019-01-08 (×3): 1 via ORAL
  Filled 2019-01-06 (×3): qty 1

## 2019-01-06 MED ORDER — MENTHOL 3 MG MT LOZG: 1.0000 | LOZENGE | OROMUCOSAL | Status: DC | PRN

## 2019-01-06 MED ORDER — COCONUT OIL OIL: 1.0000 "application " | TOPICAL_OIL | Status: DC | PRN

## 2019-01-06 MED ORDER — SIMETHICONE 80 MG PO CHEW
80.0000 mg | CHEWABLE_TABLET | ORAL | Status: DC
  Administered 2019-01-06 – 2019-01-08 (×3): 80 mg via ORAL
  Filled 2019-01-06 (×3): qty 1

## 2019-01-06 MED ORDER — SODIUM CHLORIDE 0.9% FLUSH: 3.0000 mL | INTRAVENOUS | Status: DC | PRN

## 2019-01-06 MED ORDER — SIMETHICONE 80 MG PO CHEW: 80.0000 mg | CHEWABLE_TABLET | ORAL | Status: DC | PRN

## 2019-01-06 MED ORDER — SIMETHICONE 80 MG PO CHEW
80.0000 mg | CHEWABLE_TABLET | Freq: Three times a day (TID) | ORAL | Status: DC
  Administered 2019-01-06 – 2019-01-09 (×9): 80 mg via ORAL
  Filled 2019-01-06 (×9): qty 1

## 2019-01-06 MED ORDER — DEXAMETHASONE SODIUM PHOSPHATE 10 MG/ML IJ SOLN
INTRAMUSCULAR | Status: DC | PRN
  Administered 2019-01-06: 10 mg via INTRAVENOUS

## 2019-01-06 MED ORDER — ACETAMINOPHEN 500 MG PO TABS: 1000.0000 mg | ORAL_TABLET | Freq: Four times a day (QID) | ORAL | Status: DC

## 2019-01-06 MED ORDER — LABETALOL HCL 300 MG PO TABS
300.0000 mg | ORAL_TABLET | Freq: Two times a day (BID) | ORAL | Status: DC
  Administered 2019-01-06: 300 mg via ORAL
  Filled 2019-01-06 (×2): qty 1

## 2019-01-06 MED ORDER — LABETALOL HCL 5 MG/ML IV SOLN
20.0000 mg | INTRAVENOUS | Status: DC | PRN
  Administered 2019-01-06: 20 mg via INTRAVENOUS

## 2019-01-06 MED ORDER — ENOXAPARIN SODIUM 60 MG/0.6ML ~~LOC~~ SOLN
0.5000 mg/kg | SUBCUTANEOUS | Status: DC
  Administered 2019-01-07 – 2019-01-09 (×3): 54.65 mg via SUBCUTANEOUS
  Filled 2019-01-06 (×3): qty 0.6

## 2019-01-06 MED ORDER — NALOXONE HCL 4 MG/10ML IJ SOLN
1.0000 ug/kg/h | INTRAVENOUS | Status: DC | PRN
  Filled 2019-01-06: qty 5

## 2019-01-06 MED ORDER — LABETALOL HCL 5 MG/ML IV SOLN
40.0000 mg | INTRAVENOUS | Status: DC | PRN
  Administered 2019-01-06: 40 mg via INTRAVENOUS
  Administered 2019-01-06: 60 mg via INTRAVENOUS

## 2019-01-06 MED ORDER — LABETALOL HCL 5 MG/ML IV SOLN
80.0000 mg | INTRAVENOUS | Status: DC | PRN
  Administered 2019-01-06: 80 mg via INTRAVENOUS

## 2019-01-06 MED ORDER — FENTANYL CITRATE (PF) 100 MCG/2ML IJ SOLN
25.0000 ug | INTRAMUSCULAR | Status: DC | PRN
  Administered 2019-01-06 (×2): 50 ug via INTRAVENOUS

## 2019-01-06 MED ORDER — METHADONE HCL 10 MG/ML PO CONC
105.0000 mg | Freq: Every day | ORAL | Status: DC
  Filled 2019-01-06: qty 10.5

## 2019-01-06 MED ORDER — SCOPOLAMINE 1 MG/3DAYS TD PT72: 1.0000 | MEDICATED_PATCH | Freq: Once | TRANSDERMAL | Status: DC

## 2019-01-06 MED ORDER — ONDANSETRON HCL 4 MG/2ML IJ SOLN: 4.0000 mg | Freq: Three times a day (TID) | INTRAMUSCULAR | Status: DC | PRN

## 2019-01-06 MED ORDER — LABETALOL HCL 5 MG/ML IV SOLN
INTRAVENOUS | Status: AC
  Filled 2019-01-06: qty 4

## 2019-01-06 MED ORDER — DIPHENHYDRAMINE HCL 50 MG/ML IJ SOLN: 12.5000 mg | INTRAMUSCULAR | Status: DC | PRN

## 2019-01-06 MED ORDER — FENTANYL CITRATE (PF) 100 MCG/2ML IJ SOLN
INTRAMUSCULAR | Status: AC
  Filled 2019-01-06: qty 2

## 2019-01-06 MED ORDER — TETANUS-DIPHTH-ACELL PERTUSSIS 5-2.5-18.5 LF-MCG/0.5 IM SUSP: 0.5000 mL | Freq: Once | INTRAMUSCULAR | Status: DC

## 2019-01-06 MED ORDER — DIPHENHYDRAMINE HCL 25 MG PO CAPS: 25.0000 mg | ORAL_CAPSULE | Freq: Four times a day (QID) | ORAL | Status: DC | PRN

## 2019-01-06 MED ORDER — KETOROLAC TROMETHAMINE 30 MG/ML IJ SOLN
30.0000 mg | Freq: Four times a day (QID) | INTRAMUSCULAR | Status: AC | PRN
  Administered 2019-01-06: 30 mg via INTRAMUSCULAR

## 2019-01-06 MED ORDER — HYDRALAZINE HCL 20 MG/ML IJ SOLN
10.0000 mg | INTRAMUSCULAR | Status: DC | PRN
  Administered 2019-01-06: 10 mg via INTRAVENOUS
  Filled 2019-01-06: qty 0.5

## 2019-01-06 MED ORDER — OXYCODONE HCL 5 MG PO TABS
5.0000 mg | ORAL_TABLET | ORAL | Status: DC | PRN
  Administered 2019-01-06 – 2019-01-07 (×5): 5 mg via ORAL
  Administered 2019-01-08 – 2019-01-09 (×5): 10 mg via ORAL
  Filled 2019-01-06 (×3): qty 1
  Filled 2019-01-06: qty 2
  Filled 2019-01-06 (×3): qty 1
  Filled 2019-01-06: qty 2
  Filled 2019-01-06: qty 1
  Filled 2019-01-06 (×2): qty 2
  Filled 2019-01-06: qty 1

## 2019-01-06 MED ORDER — OXYCODONE HCL 5 MG PO TABS: 5.0000 mg | ORAL_TABLET | Freq: Once | ORAL | Status: DC | PRN

## 2019-01-06 MED ORDER — OXYTOCIN 40 UNITS IN NORMAL SALINE INFUSION - SIMPLE MED: 2.5000 [IU]/h | INTRAVENOUS | Status: AC

## 2019-01-06 MED ORDER — SENNOSIDES-DOCUSATE SODIUM 8.6-50 MG PO TABS
2.0000 | ORAL_TABLET | ORAL | Status: DC
  Administered 2019-01-06 – 2019-01-08 (×3): 2 via ORAL
  Filled 2019-01-06 (×3): qty 2

## 2019-01-06 MED ORDER — LACTATED RINGERS IV SOLN
INTRAVENOUS | Status: DC
  Administered 2019-01-06 (×3): via INTRAVENOUS

## 2019-01-06 MED ORDER — KETOROLAC TROMETHAMINE 30 MG/ML IJ SOLN: 30.0000 mg | Freq: Once | INTRAMUSCULAR | Status: DC | PRN

## 2019-01-06 MED ORDER — FENTANYL CITRATE (PF) 100 MCG/2ML IJ SOLN: 25.0000 ug | INTRAMUSCULAR | Status: DC | PRN

## 2019-01-06 MED ORDER — ACETAMINOPHEN 325 MG PO TABS
650.0000 mg | ORAL_TABLET | ORAL | Status: DC | PRN
  Administered 2019-01-07 – 2019-01-09 (×6): 650 mg via ORAL
  Filled 2019-01-06 (×6): qty 2

## 2019-01-06 MED ORDER — ONDANSETRON HCL 4 MG/2ML IJ SOLN
INTRAMUSCULAR | Status: DC | PRN
  Administered 2019-01-06: 4 mg via INTRAVENOUS

## 2019-01-06 MED ORDER — NICOTINE 21 MG/24HR TD PT24
21.0000 mg | MEDICATED_PATCH | Freq: Every day | TRANSDERMAL | Status: DC
  Administered 2019-01-06 – 2019-01-09 (×5): 21 mg via TRANSDERMAL
  Filled 2019-01-06 (×6): qty 1

## 2019-01-06 MED ORDER — BUPIVACAINE HCL 0.5 % IJ SOLN
INTRAMUSCULAR | Status: DC | PRN
  Administered 2019-01-06: 30 mL

## 2019-01-06 MED ORDER — HYDRALAZINE HCL 20 MG/ML IJ SOLN
INTRAMUSCULAR | Status: AC
  Filled 2019-01-06: qty 1

## 2019-01-06 MED ORDER — METHADONE HCL 10 MG PO TABS
105.0000 mg | ORAL_TABLET | Freq: Every day | ORAL | Status: DC
  Administered 2019-01-07 – 2019-01-09 (×3): 105 mg via ORAL
  Filled 2019-01-06 (×3): qty 11

## 2019-01-06 MED ORDER — FENTANYL CITRATE (PF) 100 MCG/2ML IJ SOLN
INTRAMUSCULAR | Status: DC | PRN
  Administered 2019-01-05: 15 ug via INTRATHECAL

## 2019-01-06 MED ORDER — ONDANSETRON HCL 4 MG/2ML IJ SOLN: 4.0000 mg | Freq: Once | INTRAMUSCULAR | Status: DC | PRN

## 2019-01-06 MED ORDER — OXYCODONE HCL 5 MG/5ML PO SOLN: 5.0000 mg | Freq: Once | ORAL | Status: DC | PRN

## 2019-01-06 MED ORDER — DIBUCAINE (PERIANAL) 1 % EX OINT: 1.0000 "application " | TOPICAL_OINTMENT | CUTANEOUS | Status: DC | PRN

## 2019-01-06 MED ORDER — WITCH HAZEL-GLYCERIN EX PADS: 1.0000 "application " | MEDICATED_PAD | CUTANEOUS | Status: DC | PRN

## 2019-01-06 MED ORDER — SODIUM CHLORIDE 0.9 % IV SOLN
INTRAVENOUS | Status: DC | PRN
  Administered 2019-01-06: via INTRAVENOUS

## 2019-01-06 MED ORDER — PROMETHAZINE HCL 25 MG/ML IJ SOLN: 6.2500 mg | INTRAMUSCULAR | Status: DC | PRN

## 2019-01-06 MED ORDER — SODIUM CHLORIDE 0.9 % IV SOLN
INTRAVENOUS | Status: DC | PRN
  Administered 2019-01-06: 40 [IU] via INTRAVENOUS

## 2019-01-06 MED ORDER — KETOROLAC TROMETHAMINE 30 MG/ML IJ SOLN
30.0000 mg | Freq: Four times a day (QID) | INTRAMUSCULAR | Status: AC | PRN
  Administered 2019-01-06 (×3): 30 mg via INTRAVENOUS
  Filled 2019-01-06 (×3): qty 1

## 2019-01-06 NOTE — Progress Notes (Signed)
Subjective: Postpartum Day 0: Cesarean Delivery Patient reports tolerating PO and no problems voiding. Edema unchanged since pregnancy.  Denies HA, vision changes or epigastric pain.  RN called twice RE: borderline low urine OP, ~50 cc/hr. S/P 500 ml bolus, pushing PO fluids.   Objective: Vital signs in last 24 hours: Patient Vitals for the past 24 hrs:  BP Temp Temp src Pulse Resp SpO2  01/06/19 1731 (!) 145/87 98.3 F (36.8 C) Oral 74 18 97 %  01/06/19 1458 (!) 142/85 97.7 F (36.5 C) Oral 68 20 94 %  01/06/19 1229 (!) 150/98 - - 78 - -  01/06/19 1100 - - - - - 95 %  01/06/19 1055 - - - - - 95 %  01/06/19 1053 - - - - - 96 %  01/06/19 0932 (!) 145/82 98 F (36.7 C) Oral 70 20 96 %  01/06/19 0607 (!) 150/83 - - 71 18 96 %  01/06/19 0458 140/80 98.2 F (36.8 C) Oral 82 - 95 %  01/06/19 0404 (!) 151/85 - - 75 20 97 %  01/06/19 0303 (!) 154/92 99.1 F (37.3 C) Oral 80 18 -  01/06/19 0247 (!) 154/91 - - - - -  01/06/19 0245 (!) 136/103 - - 84 17 96 %  01/06/19 0240 - - - 81 17 96 %  01/06/19 0235 - - - 81 20 96 %  01/06/19 0230 (!) 162/96 98 F (36.7 C) Oral 78 12 96 %  01/06/19 0225 - - - 80 17 97 %  01/06/19 0220 - - - 74 12 97 %  01/06/19 0215 (!) 162/95 - - 82 16 97 %  01/06/19 0210 - - - 80 18 97 %  01/06/19 0205 - - - 73 12 96 %  01/06/19 0200 (!) 167/97 - - 79 14 97 %  01/06/19 0155 - - - 74 15 96 %  01/06/19 0150 - - - 69 13 96 %  01/06/19 0145 (!) 178/157 - - 79 18 97 %  01/06/19 0140 - - - 72 16 97 %  01/06/19 0135 - - - 72 14 98 %  01/06/19 0130 (!) 156/90 - - 80 (!) 22 97 %  01/06/19 0125 - - - 80 14 98 %  01/06/19 0120 - - - 72 17 97 %  01/06/19 0115 (!) 160/84 - - 70 15 97 %  01/06/19 0110 - - - 72 17 96 %  01/06/19 0105 - - - 68 14 97 %  01/06/19 0100 (!) 185/90 98.3 F (36.8 C) Oral 70 14 96 %  01/05/19 2305 (!) 159/88 - - 74 - -     Physical Exam:  General: alert, cooperative, appears stated age, no distress and moderately obese Lochia:  appropriate Uterine Fundus: firm Incision: no significant drainage DVT Evaluation: No evidence of DVT seen on physical exam. 2 pedal edema.   Recent Labs    01/06/19 0529 01/06/19 1800  HGB 12.1 10.9*  HCT 36.4 32.0*    Assessment/Plan: Status post Cesarean section. Postoperative course complicated by uncontrolled BP, decreased urine OP. - 500 cc bolus - Change BP meds to Losartan/HCTZ 100/25 that she was on prior to pregnancy.  D/C Labetalol.  - Checked Creatinine-->0.57 - CTO Urine OP. Consider Lasix   [name] 01/06/2019, 6:53 PM

## 2019-01-06 NOTE — Op Note (Addendum)
01/05/2019 - 01/06/2019  12:55 AM  PATIENT:  [name]  33 y.o. female  PRE-OPERATIVE DIAGNOSIS:  Repeat cesarean section; IUD Insertertion  POST-OPERATIVE DIAGNOSIS:  Repeat cesarean section; IUD Insertertion  PROCEDURE:  Procedure(s): CESAREAN SECTION AND IUD INSERTION (N/A)  SURGEON:  Surgeon(s) and Role:    * Kamarah Bilotta, Wilhemina Cash, MD - Primary   ASSISTANTS: Lattie Haw, MD   ANESTHESIA:   local and spinal  EBL:   214 per Triton, 600 by my estimation   BLOOD ADMINISTERED:none  DRAINS: Urinary Catheter (Foley)   LOCAL MEDICATIONS USED:  MARCAINE     SPECIMEN:  Source of Specimen:  cord blood  DISPOSITION OF SPECIMEN:  PATHOLOGY  COUNTS:  YES  TOURNIQUET:  * No tourniquets in log *  DICTATION: .Dragon Dictation  PLAN OF CARE: Admit to inpatient   PATIENT DISPOSITION:  PACU - hemodynamically stable.    The risks, benefits, and alternatives of surgery were explained, understood, accepted. Consents were signed. All questions were answered. In the operating room spinal anesthesia was applied without complication. Her abdomen and vagina were prepped and draped in the usual sterile fashion. A Foley catheter was placed, draining clear urine throughout case. Timeout procedure was done. After adequate anesthesia was assured 30 mL for 0.5% Marcaine was injected into the subcutaneous tissue at the site of her previous cesarean. An incision was made through the previous incision. The incision was carried down through the subcutaneous tissue to the fascia. 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 The fascia was scored the midline and extended bilaterally. The middle 50% of the rectus muscles were separated in a transverse fashion using electrosurgical technique. Excellent hemostasis was maintained. The peritoneum was entered with hemostats. Peritoneal incision was extended bilaterally with the Bovie. The bladder was adherent to the lower and middle uterine  segment. I created a bladder flap. The bladder blade was placed. A transverse incision was made on the  lower uterine segment. The uterine incision was extended with bandage scissors on each side. Amniotomy was performed with a hemostat. Clear fluid was noted. The baby was delivered from a vertex presentation.the mouth and nostrils were suctioned prior to delivery of the shoulders.  The baby's cord was clamped and cut after 1 minute delay and was transferred to the NICU personnel for routine care. The placenta was delivered intact with traction. The uterus was left in situ and the interior was cleaned with a dry lap sponge. The uterine incision was closed with 2 layers of 2-0 Vicryl running locking suture, with the second layer imbricating the first. Excellent hemostasis was noted. By tilting the uterus each side was able to visualize the adnexa, and they were normal. The rectus fascia rectus muscles were noted be hemostatic as well. The fascia was closed with a #1 PDS loop in a running nonlocking fashion. No defects were palpable. The subcutaneous tissue was irrigated, clean, and dried. I closed the deep layer of fat with a 2-0 plain gut suture.  A subcuticular closure was done with a 3-0 Vicryl suture. Steri-Strips are placed. Excellent cosmetic results were obtained. A Provena wound vac was placed. She was then placed in stirrups and a Liletta was placed. She was taken to the recovery room in stable condition. She tolerated the procedure well.

## 2019-01-06 NOTE — Transfer of Care (Signed)
Immediate Anesthesia Transfer of Care Note  Patient: [name]  Procedure(s) Performed: CESAREAN SECTION AND IUD INSERTION (N/A Abdomen)  Patient Location: PACU  Anesthesia Type:Spinal  Level of Consciousness: awake, alert  and oriented  Airway & Oxygen Therapy: Patient Spontanous Breathing  Post-op Assessment: Report given to RN, Post -op Vital signs reviewed and stable and Pt missed evening dose of Labetalol, Dove notified to place pacu antihypertensive medication orders  Post vital signs: Reviewed and stable  Last Vitals:  Vitals Value Taken Time  BP 185/90 01/06/19 0100  Temp    Pulse 69 01/06/19 0108  Resp 26 01/06/19 0108  SpO2 98 % 01/06/19 0108  Vitals shown include unvalidated device data.  Last Pain:  Vitals:   01/05/19 1815  TempSrc: Oral         Complications: No apparent anesthesia complications

## 2019-01-06 NOTE — Progress Notes (Signed)
Patient's urine amber colored and cloudy. Output in foley in 4 hours was 150 ml. Has LR with 40 Units of pitocin at 62.5 on pump and drank 360 ml PO.  Blood Pressure and pulses orthostatic done lying 148/82 HR 76, sitting 151/92 HR 77, standing 146/85 HR 78. Notified Marlou Porch, CNM.  See Orders

## 2019-01-06 NOTE — Discharge Summary (Addendum)
Postpartum Discharge Summary     Patient Name: [name] DOB: Mar 05, 1986 MRN: [id number]  Date of admission: 01/05/2019 Delivering Provider: Allie BossierVE, MYRA C   Date of discharge: 01/09/2019  Admitting diagnosis: IUGR and non- reassuring fetal testing  Intrauterine pregnancy: 1251w0d     Secondary diagnosis:  Active Problems:   S/P cesarean section   Presence of IUD  Additional problems:  Patient Active Problem List   Diagnosis Date Noted  . Presence of IUD 01/08/2019  . S/P cesarean section 01/05/2019  . Supervision of high risk pregnancy, antepartum 07/23/2018  . Chronic hypertension in pregnancy 07/23/2018  . History of cesarean delivery, antepartum 07/23/2018  . Methadone maintenance treatment affecting pregnancy, antepartum (HCC) 07/23/2018  . ALLERGIC RHINITIS 04/01/2007  . BACK PAIN WITH RADICULOPATHY 04/01/2007  . POLYCYSTIC OVARIES 02/02/2007  . BIPOLAR AFFECTIVE DISORDER 02/02/2007  . Essential hypertension 02/02/2007  . ASTHMA 02/02/2007  . HX, PERSONAL, TOBACCO USE 02/02/2007   morbid obestiy      Discharge diagnosis: Preterm Pregnancy Delivered                                                                                                Post partum procedures:postpartum tubal ligation  Complications: Severe range pressures in setting of chronic hypertension  Hospital course:  Sceduled C/S   33 y.o. yo G2P0101 at 6551w0d was admitted to the hospital 01/05/2019 for scheduled cesarean section with the following indication:Elective Primary and IUGR, non- reassuring fetal testing.  Membrane Rupture Time/Date: 12:04 AM ,01/06/2019   Patient delivered a Viable infant.01/06/2019  Details of operation can be found in separate operative note.  Pateint had an uncomplicated postpartum course.  She is ambulating, tolerating a regular diet, passing flatus, and urinating well. Patient is discharged home in stable condition on  01/09/19         Magnesium Sulfate recieved:  No BMZ received: No  Physical exam  Vitals:   01/09/19 0415 01/09/19 0650 01/09/19 0830 01/09/19 0942  BP: (!) 147/82 (!) 144/89 (!) 145/90 (!) 145/90  Pulse: 78  74   Resp: 18  18   Temp: 98 F (36.7 C)     TempSrc: Oral     SpO2: 98%  98%   Weight:      Height:       General: alert, cooperative and no distress Lochia: appropriate Uterine Fundus: firm Incision: Healing well with no significant drainage, No significant erythema, Dressing is clean, dry, and intact DVT Evaluation: No evidence of DVT seen on physical exam. Labs: Lab Results  Component Value Date   WBC 11.1 (H) 01/09/2019   HGB 12.1 01/09/2019   HCT 37.2 01/09/2019   MCV 94.2 01/09/2019   PLT 300 01/09/2019   CMP Latest Ref Rng & Units 01/09/2019  Glucose 70 - 99 mg/dL 956(O110(H)  BUN 6 - 20 mg/dL 11  Creatinine 1.300.44 - 8.651.00 mg/dL 7.840.59  Sodium 696135 - 295145 mmol/L 138  Potassium 3.5 - 5.1 mmol/L 4.9  Chloride 98 - 111 mmol/L 99  CO2 22 - 32 mmol/L 26  Calcium 8.9 - 10.3 mg/dL  9.7  Total Protein 6.5 - 8.1 g/dL 6.4(L)  Total Bilirubin 0.3 - 1.2 mg/dL 1.1  Alkaline Phos 38 - 126 U/L 119  AST 15 - 41 U/L 39  ALT 0 - 44 U/L 23    Discharge instruction: per After Visit Summary and "Baby and Me Booklet".  After visit meds:  Allergies as of 01/09/2019   No Known Allergies     Medication List    STOP taking these medications   labetalol 200 MG tablet Commonly known as: NORMODYNE     TAKE these medications   acetaminophen 325 MG tablet Commonly known as: TYLENOL Take 2 tablets (650 mg total) by mouth every 4 (four) hours as needed for mild pain (temperature > 101.5.).   aspirin EC 81 MG tablet Take 1 tablet (81 mg total) by mouth daily. Take after 12 weeks for prevention of preeclampsia later in pregnancy   lisinopril 20 MG tablet Commonly known as: ZESTRIL Take 1 tablet (20 mg total) by mouth 2 (two) times daily.   methadone 0.4 mg/mL Soln Commonly known as: DOLOPHINE Take 105 mg by mouth daily.    NIFEdipine 30 MG 24 hr tablet Commonly known as: ADALAT CC Take 1 tablet (30 mg total) by mouth 2 (two) times daily.   oxyCODONE 5 MG immediate release tablet Commonly known as: Oxy IR/ROXICODONE Take 1-2 tablets (5-10 mg total) by mouth every 4 (four) hours as needed for moderate pain.   prenatal vitamin w/FE, FA 27-1 MG Tabs tablet Take 1 tablet by mouth daily at 12 noon.       Diet: routine diet  Activity: Advance as tolerated. Pelvic rest for 6 weeks.   Outpatient follow up:1 week for removal of wound vac and blood pressure check, 2 weeks for wound check Follow up Appt: Future Appointments  Date Time Provider [place]  01/21/2019  9:40 AM [place]  02/17/2019  9:15 AM Emily Filbert, MD [facility]   Follow up Visit:  Please schedule this patient for Postpartum visit in: 1 week with the following provider: Any provider For C/S patients schedule nurse incision check in weeks 2 weeks: yes Delivery mode:  CS, repeat and placement of Liletta IUD Anticipated Birth Control:  IUD placed during cesarean section PP Procedures needed: Incision check  Schedule Integrated BH visit: no   Newborn Data: Live born female  Birth Weight: 8, 9 APGAR: 8, 9  Newborn Delivery   Birth date/time: 01/06/2019 00:05:00 Delivery type: C-Section, Low Transverse Trial of labor: No C-section categorization: Repeat      Baby Feeding: Bottle Disposition:rooming in   01/09/2019 Matilde Haymaker, MD   DISCHARGE ATTESTATION  I have seen and examined this patient and agree with above documentation in the resident's note. Blood pressure significantly improved with change in blood pressure regimen yesterday. Patient's headache resolved with PO Tylenol this morning. Patient instructed to schedule blood pressure check for Friday 01/14/19.   [name], CNM Certified Nurse Midwife, Faculty Practice 01/09/2019 11:20 AM

## 2019-01-06 NOTE — Anesthesia Preprocedure Evaluation (Signed)
Anesthesia Evaluation  Patient identified by MRN, date of birth, ID band Patient awake    Reviewed: Allergy & Precautions, H&P , NPO status , Patient's Chart, lab work & pertinent test results, reviewed documented beta blocker date and time   History of Anesthesia Complications Negative for: history of anesthetic complications  Airway Mallampati: III  TM Distance: >3 FB Neck ROM: full    Dental no notable dental hx. (+) Upper Dentures, Lower Dentures   Pulmonary neg pulmonary ROS, Current Smoker,    Pulmonary exam normal        Cardiovascular hypertension, Pt. on medications and Pt. on home beta blockers Normal cardiovascular exam     Neuro/Psych negative neurological ROS  negative psych ROS   GI/Hepatic negative GI ROS, (+)     substance abuse  ,   Endo/Other  Morbid obesity  Renal/GU negative Renal ROS  negative genitourinary   Musculoskeletal  (+) narcotic dependent  Abdominal   Peds  Hematology negative hematology ROS (+)   Anesthesia Other Findings methadone  Reproductive/Obstetrics (+) Pregnancy                             Anesthesia Physical Anesthesia Plan  ASA: III  Anesthesia Plan: Spinal   Post-op Pain Management:    Induction:   PONV Risk Score and Plan: Ondansetron and Treatment may vary due to age or medical condition  Airway Management Planned:   Additional Equipment:   Intra-op Plan:   Post-operative Plan:   Informed Consent: I have reviewed the patients History and Physical, chart, labs and discussed the procedure including the risks, benefits and alternatives for the proposed anesthesia with the patient or authorized representative who has indicated his/her understanding and acceptance.       Plan Discussed with:   Anesthesia Plan Comments:         Anesthesia Quick Evaluation

## 2019-01-06 NOTE — Anesthesia Postprocedure Evaluation (Signed)
Anesthesia Post Note  Patient: [name]  Procedure(s) Performed: CESAREAN SECTION AND IUD INSERTION (N/A Abdomen)     Patient location during evaluation: PACU Anesthesia Type: Spinal Level of consciousness: oriented and awake and alert Pain management: pain level controlled Vital Signs Assessment: post-procedure vital signs reviewed and stable Respiratory status: spontaneous breathing, respiratory function stable and nonlabored ventilation Cardiovascular status: blood pressure returned to baseline and stable Postop Assessment: no headache, no backache, no apparent nausea or vomiting and spinal receding Anesthetic complications: no    Last Vitals:  Vitals:   01/06/19 0458 01/06/19 0607  BP: 140/80 (!) 150/83  Pulse: 82 71  Resp:  18  Temp: 36.8 C   SpO2: 95% 96%    Last Pain:  Vitals:   01/06/19 0458  TempSrc: Oral  PainSc:    Pain Goal:                   [name]

## 2019-01-06 NOTE — Anesthesia Procedure Notes (Signed)
Spinal  Patient location during procedure: OR Staffing Anesthesiologist: Sophi Calligan E, MD Performed: anesthesiologist  Preanesthetic Checklist Completed: patient identified, surgical consent, pre-op evaluation, timeout performed, IV checked, risks and benefits discussed and monitors and equipment checked Spinal Block Patient position: sitting Prep: site prepped and draped and DuraPrep Patient monitoring: continuous pulse ox, blood pressure and heart rate Approach: midline Location: L3-4 Injection technique: single-shot Needle Needle type: Pencan  Needle gauge: 24 G Needle length: 9 cm Additional Notes Functioning IV was confirmed and monitors were applied. Sterile prep and drape, including hand hygiene and sterile gloves were used. The patient was positioned and the spine was prepped. The skin was anesthetized with lidocaine.  Free flow of clear CSF was obtained prior to injecting local anesthetic into the CSF. The needle was carefully withdrawn. The patient tolerated the procedure well.      

## 2019-01-07 LAB — URINE CULTURE
Culture: NO GROWTH
Special Requests: NORMAL

## 2019-01-07 NOTE — Clinical Social Work Maternal (Signed)
CLINICAL SOCIAL WORK MATERNAL/CHILD NOTE  Patient Details  Name: Maycel M Span MRN: [id number] Date of Birth: 10/27/1985  Date:  01/07/2019  Clinical Social Worker Initiating Note:  Torien Ramroop Boyd-Gilyard Date/Time: Initiated:  01/07/19/1512     Child's Name:  Sofia Dreyfuss   Biological Parents:  Mother, Father   Need for Interpreter:  None   Reason for Referral:  Behavioral Health Concerns   Address:  [address] [place] [address]    Phone number:  [phone] (home)     Additional phone number:   Household Members/Support Persons (HM/SP):   Household Member/Support Person 1, Household Member/Support Person 2   HM/SP Name Relationship DOB or Age  HM/SP -1 Louis Lopez FOB 06/25/1978  HM/SP -2 Isabella Popov daughter 05/31/2010  HM/SP -3        HM/SP -4        HM/SP -5        HM/SP -6        HM/SP -7        HM/SP -8          Natural Supports (not living in the home):  Extended Family, Parent, Immediate Family, Friends   Professional Supports: Therapist(MOB receives outpatient counseling with [facility] (therapist is Mia Jamison))   Employment: Unemployed   Type of Work:     Education:  High school graduate   Homebound arranged:    Financial Resources:  Medicaid(MOB reports that she receives unemployement benefits.)   Other Resources:  WIC, Food Stamps    Cultural/Religious Considerations Which May Impact Care:  Per MOB, MOB is Christian.   Strengths:  Ability to meet basic needs , Pediatrician chosen, Home prepared for child , Understanding of illness, Psychotropic Medications, Compliance with medical plan    Psychotropic Medications:  Methadone      Pediatrician:    [place] area  Pediatrician List:   [facility]  [place]    [place]    [place]    [place]    [place]      Pediatrician Fax Number:    Risk Factors/Current Problems:  Mental Health Concerns , Substance Use     Cognitive State:  Linear Thinking , Insightful , Goal Oriented    Mood/Affect:  Interested , Happy , Relaxed , Tearful , Comfortable    CSW Assessment: CSW met with MOB in NICU conference room.  CSW went to MOB's room and FOB was eating lunch so MOB agreed to come with CSW to conference to so CSW will be HIPAA compliant. MOB was polite, easy to engage, and was tearful when MOB spoke about infant's prematurity.  MOB also was receptive to meeting with CSW.   CSW asked about MOB's MH hx.  MOB acknowledged a dx of Bipolar dx and reported that she was dx around age 16.  However, MOB reported being reassessed and MOB's dx was ruled and therapist communicated to MOB that MOB's symptoms were substance induced. MOB shared that MOB receives outpatient counseling and medication management (Methadone) at [facility].  MOB happily reported that MOB's has been in her sobriety for over 7 years; CSW praised MOB. CSW provided education regarding the baby blues period vs. perinatal mood disorders, discussed treatment and gave resources for mental health follow up if concerns arise.  CSW recommends self-evaluation during the postpartum time period using the New Mom Checklist from Postpartum Progress and encouraged MOB to contact a medical professional if symptoms are noted at any time.  Although MOB   was appropriately  tearful when talking about infant's health MOB did not demonstrate any acute MH symptoms. CSW assessed for safety and MOB denied SI, HI, and DV.   CSW informed MOB of the hospital's drug screen policy. MOB was made aware of the 2 drug screenings for the infant.  MOB was understanding and did not have any concerns.  CSW shared with MOB that the infant had a negative UDS, and CSW will monitor the infant's CDS and will make a report to [place] CPS if warranted. MOB denied the use of all other illicit substances.   CSW assessed for essential items and MOB tearfully shared that she does not have a safe  sleeping area, bottles, and other essential items. CSW informed MOB of resources that [facility] can provide and MOB was grateful (CSW emailed [facility] and updated regarding MOB's needs).    CSW will continue to provide resources and supports to family while infant remains in the NICU.  CSW Plan/Description:  Psychosocial Support and Ongoing Assessment of Needs, Sudden Infant Death Syndrome (SIDS) Education, Perinatal Mood and Anxiety Disorder (PMADs) Education, Neonatal Abstinence Syndrome (NAS) Education, Other Patient/Family Education, Hospital Drug Screen Policy Information, Other Information/Referral to Community Resources, CSW Will Continue to Monitor Umbilical Cord Tissue Drug Screen Results and Make Report if Warranted   Karol Skarzynski Boyd-Gilyard, MSW, LCSW Clinical Social Work [phone]  Devaughn Savant D BOYD-GILYARD, LCSW 01/07/2019, 3:20 PM 

## 2019-01-07 NOTE — Progress Notes (Signed)
Subjective: Postpartum Day 1: Cesarean Delivery Patient reports incisional pain, tolerating PO and no problems voiding.    Objective: Vital signs in last 24 hours: Temp:  [97.7 F (36.5 C)-98.3 F (36.8 C)] 98.1 F (36.7 C) (08/28 0500) Pulse Rate:  [68-79] 76 (08/28 0500) Resp:  [17-20] 19 (08/28 0500) BP: (131-150)/(75-98) 131/75 (08/28 0500) SpO2:  [94 %-97 %] 95 % (08/28 0500) Vitals:   01/06/19 1458 01/06/19 1731 01/06/19 2140 01/07/19 0500  BP: (!) 142/85 (!) 145/87 140/75 131/75  Pulse: 68 74 79 76  Resp: 20 18 17 19   Temp: 97.7 F (36.5 C) 98.3 F (36.8 C) 98.3 F (36.8 C) 98.1 F (36.7 C)  TempSrc: Oral Oral Oral Oral  SpO2: 94% 97% 96% 95%  Weight:      Height:        Physical Exam:  General: alert, cooperative and no distress Lochia: appropriate Uterine Fundus: firm Incision: no significant drainage DVT Evaluation: No evidence of DVT seen on physical exam.  Recent Labs    01/06/19 0529 01/06/19 1800  HGB 12.1 10.9*  HCT 36.4 32.0*    Assessment/Plan: Status post Cesarean section. Doing well postoperatively.  Continue current care.  Hansel Feinstein 01/07/2019, 9:04 AM

## 2019-01-08 ENCOUNTER — Encounter (HOSPITAL_COMMUNITY): Payer: Self-pay | Admitting: Anesthesiology

## 2019-01-08 DIAGNOSIS — Z975 Presence of (intrauterine) contraceptive device: Secondary | ICD-10-CM

## 2019-01-08 MED ORDER — NIFEDIPINE 10 MG PO CAPS: 30.0000 mg | ORAL_CAPSULE | Freq: Two times a day (BID) | ORAL | Status: DC

## 2019-01-08 MED ORDER — NIFEDIPINE ER OSMOTIC RELEASE 30 MG PO TB24
30.0000 mg | ORAL_TABLET | Freq: Two times a day (BID) | ORAL | Status: DC
  Administered 2019-01-08 – 2019-01-09 (×3): 30 mg via ORAL
  Filled 2019-01-08 (×7): qty 1

## 2019-01-08 MED ORDER — LISINOPRIL 20 MG PO TABS
20.0000 mg | ORAL_TABLET | Freq: Two times a day (BID) | ORAL | Status: DC
  Administered 2019-01-08 – 2019-01-09 (×2): 20 mg via ORAL
  Filled 2019-01-08 (×3): qty 1

## 2019-01-08 MED ORDER — LISINOPRIL 20 MG PO TABS
20.0000 mg | ORAL_TABLET | Freq: Every day | ORAL | Status: DC
  Administered 2019-01-08: 11:00:00 20 mg via ORAL
  Filled 2019-01-08: qty 1

## 2019-01-08 NOTE — Progress Notes (Signed)
Rounding attempted, patient not in room, not on floor.  Mallie Snooks, MSN, CNM Certified Nurse Midwife, Faculty Practice 01/08/19 8:43 AM

## 2019-01-08 NOTE — Progress Notes (Signed)
Subjective: Postpartum Day 2: Cesarean Delivery 01/06/19 at [facility] Patient reports tolerating PO, + flatus and no problems voiding. Mild incisional pain resolves with prescribed medications  Objective: Vital signs in last 24 hours: Temp:  [98 F (36.7 C)-98.5 F (36.9 C)] 98.5 F (36.9 C) (08/28 2104) Pulse Rate:  [63-78] 63 (08/29 0640) Resp:  [17-20] 20 (08/29 0616) BP: (132-173)/(89-97) 157/97 (08/29 0640) SpO2:  [97 %] 97 % (08/29 0616)  Physical Exam:  General: alert, cooperative, appears stated age and no distress Lochia: appropriate Uterine Fundus: firm Incision: Provena wound vac in place and functioning DVT Evaluation: No evidence of DVT seen on physical exam.  Recent Labs    01/06/19 0529 01/06/19 1800  HGB 12.1 10.9*  HCT 36.4 32.0*   Patient Vitals for the past 24 hrs:  BP Temp Temp src Pulse Resp SpO2  01/08/19 0640 (!) 157/97 - - 63 - -  01/08/19 0616 (!) 173/89 - Oral 75 20 97 %  01/07/19 2104 (!) 152/91 98.5 F (36.9 C) Oral 78 18 97 %  01/07/19 1757 (!) 139/94 - - 74 - -  01/07/19 1340 (!) 132/93 98 F (36.7 C) Oral 77 17 -   Assessment/Plan: Status post Cesarean section and IUD placement. Per consult with Dr. Elly Modena, blood pressure medication regimen altered as of this morning. Continue to monitor Doing well postoperatively Plan for discharge tomorrow  [name], CNM 01/08/2019, 9:28 AM

## 2019-01-08 NOTE — Progress Notes (Signed)
RN checked BP 3 times at 0616 and pt's lowest measured BP was 173/89. Pt was in pain 8/10 and had just been given pain medication. RN rechecked BP at 0640 and it was 157/97. Pt asymptomatic. Dr. Posey Pronto notified of severe range BP, no new orders. RN educated pt on signs and symptoms of pre-eclampsia and encouraged her to let RN know if she becomes symptomatic. Oncoming nurse made aware, will continue to monitor.  Gearldine Bienenstock, RN 01/08/2019 7:04 AM

## 2019-01-09 LAB — CBC
HCT: 37.2 % (ref 36.0–46.0)
Hemoglobin: 12.1 g/dL (ref 12.0–15.0)
MCH: 30.6 pg (ref 26.0–34.0)
MCHC: 32.5 g/dL (ref 30.0–36.0)
MCV: 94.2 fL (ref 80.0–100.0)
Platelets: 300 10*3/uL (ref 150–400)
RBC: 3.95 MIL/uL (ref 3.87–5.11)
RDW: 12.8 % (ref 11.5–15.5)
WBC: 11.1 10*3/uL — ABNORMAL HIGH (ref 4.0–10.5)
nRBC: 0 % (ref 0.0–0.2)

## 2019-01-09 LAB — COMPREHENSIVE METABOLIC PANEL
ALT: 23 U/L (ref 0–44)
AST: 39 U/L (ref 15–41)
Albumin: 2.7 g/dL — ABNORMAL LOW (ref 3.5–5.0)
Alkaline Phosphatase: 119 U/L (ref 38–126)
Anion gap: 13 (ref 5–15)
BUN: 11 mg/dL (ref 6–20)
CO2: 26 mmol/L (ref 22–32)
Calcium: 9.7 mg/dL (ref 8.9–10.3)
Chloride: 99 mmol/L (ref 98–111)
Creatinine, Ser: 0.59 mg/dL (ref 0.44–1.00)
GFR calc Af Amer: >60 mL/min (ref >60)
GFR calc non Af Amer: >60 mL/min (ref >60)
Glucose, Bld: 110 mg/dL — ABNORMAL HIGH (ref 70–99)
Potassium: 4.9 mmol/L (ref 3.5–5.1)
Sodium: 138 mmol/L (ref 135–145)
Total Bilirubin: 1.1 mg/dL (ref 0.3–1.2)
Total Protein: 6.4 g/dL — ABNORMAL LOW (ref 6.5–8.1)

## 2019-01-09 MED ORDER — LISINOPRIL 20 MG PO TABS: 20.0000 mg | ORAL_TABLET | Freq: Two times a day (BID) | ORAL | 0 refills | Status: DC

## 2019-01-09 MED ORDER — ACETAMINOPHEN 325 MG PO TABS: 650.0000 mg | ORAL_TABLET | ORAL | Status: AC | PRN

## 2019-01-09 MED ORDER — NIFEDIPINE ER 30 MG PO TB24: 30.0000 mg | ORAL_TABLET | Freq: Two times a day (BID) | ORAL | 0 refills | Status: DC

## 2019-01-09 MED ORDER — OXYCODONE HCL 5 MG PO TABS: 5.0000 mg | ORAL_TABLET | ORAL | 0 refills | Status: DC | PRN

## 2019-01-09 NOTE — Discharge Instructions (Signed)

## 2019-01-09 NOTE — Progress Notes (Signed)
Pt informed RN of headache that came on gradually, pain 5/10; no visual disturbances or abdominal pain. No clonus. BP 144/89. Pt repositioned, lights dimmed, cold compress given for head. Will continue to monitor.

## 2019-01-09 NOTE — Progress Notes (Signed)
Lab tried collecting specimen on pt this morning without success; informed RN that they will send someone else to try to collect morning labs shortly.

## 2019-01-10 ENCOUNTER — Encounter (HOSPITAL_COMMUNITY): Payer: Self-pay | Admitting: Obstetrics & Gynecology

## 2019-01-10 ENCOUNTER — Encounter: Payer: Medicaid Other | Admitting: Family Medicine

## 2019-01-11 ENCOUNTER — Ambulatory Visit (HOSPITAL_COMMUNITY): Payer: Medicaid Other

## 2019-01-12 ENCOUNTER — Telehealth: Payer: Self-pay | Admitting: Obstetrics and Gynecology

## 2019-01-12 NOTE — Telephone Encounter (Signed)
Attempted to call patient about her appointment on 9/3 @ 9:00. No answer left voicemail instructing patient to wear a face mask for the entire appointment and no visitors are allowed during the visit. Patient instructed not to attend the appointment if she was any symptoms. Symptom list and office number left.

## 2019-01-13 ENCOUNTER — Other Ambulatory Visit: Payer: Self-pay

## 2019-01-13 ENCOUNTER — Encounter: Payer: Self-pay | Admitting: Obstetrics and Gynecology

## 2019-01-13 ENCOUNTER — Ambulatory Visit (INDEPENDENT_AMBULATORY_CARE_PROVIDER_SITE_OTHER): Payer: Medicaid Other | Admitting: Obstetrics and Gynecology

## 2019-01-13 VITALS — BP 128/83 | HR 106 | Ht 63.0 in | Wt 231.0 lb

## 2019-01-13 DIAGNOSIS — Z98891 History of uterine scar from previous surgery: Secondary | ICD-10-CM

## 2019-01-13 DIAGNOSIS — I10 Essential (primary) hypertension: Secondary | ICD-10-CM

## 2019-01-13 MED ORDER — OXYCODONE HCL 5 MG PO TABS: 5.0000 mg | ORAL_TABLET | Freq: Four times a day (QID) | ORAL | 0 refills | Status: DC | PRN

## 2019-01-13 NOTE — Progress Notes (Signed)
[name] is here for incision check and BP check S/P LTCS with IUD placement on 01/06/19. Doing well, only complaint is sore. No Ha or visual changes Denies bowel or bladder dysfunction  PE AF VSS Lungs clear Heart RRR Abd soft + BS  Provena removed, incision healing well, no S/Sx of infection  A/P S/P LTCS        Incision check        CHTN Doing well. Continue with current antihypertensive regiment Increase activity as tolerates. Wound care reviewed. Renewed Percocet # 24 no refill

## 2019-01-18 ENCOUNTER — Other Ambulatory Visit (HOSPITAL_COMMUNITY)
Admission: RE | Admit: 2019-01-18 | Discharge: 2019-01-18 | Disposition: A | Discharge status: Home / Self Care | Payer: Medicaid Other | Source: Ambulatory Visit | Attending: Internal Medicine | Admitting: Internal Medicine

## 2019-01-19 ENCOUNTER — Ambulatory Visit (HOSPITAL_COMMUNITY): Payer: Medicaid Other

## 2019-01-20 ENCOUNTER — Inpatient Hospital Stay (HOSPITAL_COMMUNITY): Admit: 2019-01-20 | Payer: Medicaid Other | Admitting: Obstetrics and Gynecology

## 2019-01-21 ENCOUNTER — Ambulatory Visit: Payer: Medicaid Other

## 2019-01-24 ENCOUNTER — Encounter: Payer: Medicaid Other | Admitting: Obstetrics & Gynecology

## 2019-02-16 ENCOUNTER — Telehealth: Payer: Self-pay | Admitting: Family Medicine

## 2019-02-16 NOTE — Telephone Encounter (Signed)
Called the patient to confirm the upcoming appointment. Received a message Im sorry your call can not be completed at this time. Please hang up and try your call again later.

## 2019-02-17 ENCOUNTER — Ambulatory Visit: Payer: Medicaid Other | Admitting: Obstetrics & Gynecology

## 2019-03-12 ENCOUNTER — Other Ambulatory Visit: Payer: Self-pay | Admitting: Family Medicine

## 2019-04-04 ENCOUNTER — Other Ambulatory Visit: Payer: Self-pay | Admitting: Family Medicine

## 2020-09-15 ENCOUNTER — Emergency Department (HOSPITAL_BASED_OUTPATIENT_CLINIC_OR_DEPARTMENT_OTHER): Payer: Medicaid Other

## 2020-09-15 ENCOUNTER — Encounter (HOSPITAL_BASED_OUTPATIENT_CLINIC_OR_DEPARTMENT_OTHER): Payer: Self-pay | Admitting: *Deleted

## 2020-09-15 ENCOUNTER — Other Ambulatory Visit: Payer: Self-pay

## 2020-09-15 ENCOUNTER — Emergency Department (HOSPITAL_BASED_OUTPATIENT_CLINIC_OR_DEPARTMENT_OTHER)
Admission: EM | Admit: 2020-09-15 | Discharge: 2020-09-15 | Disposition: A | Discharge status: Home / Self Care | Payer: Medicaid Other | Attending: Emergency Medicine | Admitting: Emergency Medicine

## 2020-09-15 DIAGNOSIS — R600 Localized edema: Secondary | ICD-10-CM | POA: Insufficient documentation

## 2020-09-15 DIAGNOSIS — F17210 Nicotine dependence, cigarettes, uncomplicated: Secondary | ICD-10-CM | POA: Insufficient documentation

## 2020-09-15 DIAGNOSIS — Z79899 Other long term (current) drug therapy: Secondary | ICD-10-CM | POA: Diagnosis not present

## 2020-09-15 DIAGNOSIS — R2243 Localized swelling, mass and lump, lower limb, bilateral: Secondary | ICD-10-CM | POA: Diagnosis present

## 2020-09-15 DIAGNOSIS — I10 Essential (primary) hypertension: Secondary | ICD-10-CM | POA: Insufficient documentation

## 2020-09-15 NOTE — ED Triage Notes (Signed)
Pt reports lower extremity swelling x 2 days. She just started seeing a cardiologist and was advised to be checked out

## 2020-09-15 NOTE — ED Provider Notes (Signed)
[facility] EMERGENCY DEPARTMENT Provider Note   CSN: [id number] Arrival date & time: 09/15/20  1315     History Chief Complaint  Patient presents with  . Leg Swelling    [name] is a 35 y.o. female.  HPI Patient is a 35 year old female with a history of drug abuse, hypertension, PCOS, who presents the emergency department due to lower extremity edema.  Patient states that about 2 days ago she started experiencing atraumatic swelling in the lower extremities, right greater than left.  She states her symptoms have been persistent.  Denies any chest pain or shortness of breath.  No history of similar symptoms.  She states that she is not regularly on her feet throughout the day.  Denies any trauma to the legs.  No history of blood clots.  No recent travel.  No hemoptysis.  She states she has an IUD but denies any oral estrogen use.    Past Medical History:  Diagnosis Date  . Chronic back pain   . Chronic knee pain   . Drug abuse (HCC)   . Hypertension   . Long-term current use of methadone for opiate dependence (HCC)   . PCOS (polycystic ovarian syndrome)   . Scoliosis     Patient Active Problem List   Diagnosis Date Noted  . Presence of IUD 01/08/2019  . S/P cesarean section 01/05/2019  . Chronic hypertension in pregnancy 07/23/2018  . History of cesarean delivery, antepartum 07/23/2018  . Methadone maintenance treatment affecting pregnancy, antepartum (HCC) 07/23/2018  . ALLERGIC RHINITIS 04/01/2007  . BACK PAIN WITH RADICULOPATHY 04/01/2007  . POLYCYSTIC OVARIES 02/02/2007  . BIPOLAR AFFECTIVE DISORDER 02/02/2007  . Essential hypertension 02/02/2007  . ASTHMA 02/02/2007  . HX, PERSONAL, TOBACCO USE 02/02/2007    Past Surgical History:  Procedure Laterality Date  . CESAREAN SECTION    . CESAREAN SECTION N/A 01/05/2019   Procedure: CESAREAN SECTION AND IUD INSERTION;  Surgeon: Allie Bossier, MD;  Location: [facility] LD ORS;  Service: Obstetrics;  Laterality:  N/A;  . GANGLION CYST EXCISION    . KNEE SURGERY    . MULTIPLE TOOTH EXTRACTIONS     Pt has full upper and lower dentures     OB History    Gravida  2   Para  1   Term      Preterm  1   AB      Living  1     SAB      IAB      Ectopic      Multiple      Live Births  1           Family History  Problem Relation Age of Onset  . Diabetes Mother   . Heart disease Mother   . Hypertension Mother   . Varicose Veins Mother   . Arthritis Mother   . Anxiety disorder Mother   . Depression Mother   . Early death Mother   . Alcohol abuse Father   . Hypertension Father   . Stroke Father   . Anxiety disorder Father   . Depression Father   . Drug abuse Sister   . Alcohol abuse Sister     Social History   Tobacco Use  . Smoking status: Current Every Day Smoker    Packs/day: 0.25    Types: Cigarettes  . Smokeless tobacco: Never Used  Vaping Use  . Vaping Use: Never used  Substance Use Topics  . Alcohol  use: Not Currently  . Drug use: Not Currently    Types: Oxycodone, Cocaine, Marijuana    Comment: oxycontin, opana, neurontin stopped 2011, on methadone now    Home Medications Prior to Admission medications   Medication Sig Start Date End Date Taking? Authorizing Provider  acetaminophen (TYLENOL) 325 MG tablet Take 2 tablets (650 mg total) by mouth every 4 (four) hours as needed for mild pain (temperature > 101.5.). 01/09/19   Mirian Mo, MD  lisinopril (ZESTRIL) 20 MG tablet Take 1 tablet (20 mg total) by mouth 2 (two) times daily. 01/09/19   Mirian Mo, MD  methadone (DOLOPHINE) 0.4 mg/mL SOLN Take 105 mg by mouth daily.     [provider]  NIFEdipine (ADALAT CC) 30 MG 24 hr tablet Take 1 tablet (30 mg total) by mouth 2 (two) times daily. 01/09/19   Mirian Mo, MD  oxyCODONE (OXY IR/ROXICODONE) 5 MG immediate release tablet Take 1 tablet (5 mg total) by mouth every 6 (six) hours as needed for moderate pain. 01/13/19   Hermina Staggers, MD   prenatal vitamin w/FE, FA (PRENATAL 1 + 1) 27-1 MG TABS tablet Take 1 tablet by mouth daily at 12 noon. 06/15/18   Levie Heritage, DO    Allergies    Patient has no known allergies.  Review of Systems   Review of Systems  All other systems reviewed and are negative. Ten systems reviewed and are negative for acute change, except as noted in the HPI.    Physical Exam Updated Vital Signs BP 136/83 (BP Location: Right Arm)   Pulse 78   Temp 98.1 F (36.7 C) (Oral)   Resp 18   Ht 5\' 3"  (1.6 m)   Wt 119.3 kg   SpO2 96%   Breastfeeding No   BMI 46.59 kg/m   Physical Exam Vitals and nursing note reviewed.  Constitutional:      General: She is not in acute distress.    Appearance: Normal appearance. She is not ill-appearing, toxic-appearing or diaphoretic.  HENT:     Head: Normocephalic and atraumatic.     Right Ear: External ear normal.     Left Ear: External ear normal.     Nose: Nose normal.     Mouth/Throat:     Mouth: Mucous membranes are moist.     Pharynx: Oropharynx is clear. No oropharyngeal exudate or posterior oropharyngeal erythema.  Eyes:     Extraocular Movements: Extraocular movements intact.  Cardiovascular:     Rate and Rhythm: Normal rate and regular rhythm.     Pulses: Normal pulses.     Heart sounds: Normal heart sounds. No murmur heard. No friction rub. No gallop.   Pulmonary:     Effort: Pulmonary effort is normal. No respiratory distress.     Breath sounds: Normal breath sounds. No stridor. No wheezing, rhonchi or rales.  Abdominal:     General: Abdomen is flat.     Tenderness: There is no abdominal tenderness.  Musculoskeletal:        General: Normal range of motion.     Cervical back: Normal range of motion and neck supple. No tenderness.     Right lower leg: Edema present.     Left lower leg: No edema.     Comments: Difficult to assess lower extremities due to body habitus.  No edema appreciated in the left leg.  Trace nonpitting edema noted  in the right lower extremity through the dorsum of the right foot along  the right lower leg just inferior to the calf.  No tenderness appreciated in the legs.  No overlying skin changes.  2+ DP pulses.  Good cap refill.  Distal sensation intact.  Skin:    General: Skin is warm and dry.  Neurological:     General: No focal deficit present.     Mental Status: She is alert and oriented to person, place, and time.  Psychiatric:        Mood and Affect: Mood normal.        Behavior: Behavior normal.     ED Results / Procedures / Treatments   Labs (all labs ordered are listed, but only abnormal results are displayed) Labs Reviewed - No data to display  EKG None  Radiology US Venous Img Lower Unilateral Right  Result Date: 09/15/2020 CLINICAL DATA:  Right lower leg swelling for 2 days. EXAM: RIGHT LOWER EXTREMITY VENOUS DOPPLER ULTRASOUND TECHNIQUE: Gray-scale sonography with compression, as well as color and duplex ultrasound, were performed to evaluate the deep venous system(s) from the level of the common femoral vein through the popliteal and proximal calf veins. COMPARISON:  None. FINDINGS: VENOUS Normal compressibility of the common femoral, superficial femoral, and popliteal veins. The calf veins are not well visualized. Visualized portions of profunda femoral vein and great saphenous vein unremarkable. No filling defects to suggest DVT on grayscale or color Doppler imaging. Doppler waveforms show normal direction of venous flow, normal respiratory plasticity and response to augmentation. Limited views of the contralateral common femoral vein are unremarkable. OTHER None. Limitations: Body habitus limits visualization of the calf veins. IMPRESSION: Negative, however the calf veins are not well visualized. Electronically Signed   By: Romona Curlsyler  Litton M.D.   On: 09/15/2020 15:28    Procedures Procedures   Medications Ordered in ED Medications - No data to display  ED Course  I have reviewed  the triage vital signs and the nursing notes.  Pertinent labs & imaging results that were available during my care of the patient were reviewed by me and considered in my medical decision making (see chart for details).    MDM Rules/Calculators/A&P                          Pt is a 35 y.o. female who presents the emergency department due to right leg swelling.  Imaging: DVT ultrasound of the right leg is negative.  I, Placido SouLogan Jamarie Mussa, PA-C, personally reviewed and evaluated these images and lab results as part of my medical decision-making.  Unsure the source of patient's swelling.  It is very mild on my exam though difficult to assess due to her body habitus.  She is having no leg pain or calf pain.  No palpable cords.  Ultrasound of the right leg was negative for DVT.  No chest pain or shortness of breath.  No tachycardia or hypoxia.  Exam not concerning for PE at this time.    Feel the patient is stable for discharge at this time and she is agreeable.  She is going to follow-up with her PCP as well as her cardiologist next week if her symptoms persist.  Discussed elevating her legs as well as compression stockings.  We discussed return precautions in length.  Her questions were answered and she was amicable at the time of discharge.  Note: Portions of this report may have been transcribed using voice recognition software. Every effort was made to ensure accuracy; however, inadvertent computerized  transcription errors may be present.    Final Clinical Impression(s) / ED Diagnoses Final diagnoses:  Leg edema    Rx / DC Orders ED Discharge Orders    None       Placido Sou, PA-C 09/15/20 1541    Linwood Dibbles, MD 09/16/20 [phone]

## 2020-09-15 NOTE — Discharge Instructions (Signed)
Please continue to monitor your symptoms closely.  If you continue to have worsening swelling in the legs, chest pain, or shortness of breath, you need to return to the emergency department immediately for reevaluation.  I would recommend following up with your regular doctor as well as your cardiologist regarding your symptoms.  I would also recommend that you start wearing compression stockings and elevating your legs in the evening.  You can purchase compression stockings at your local pharmacy.  Please ask the pharmacist where they are located.   It was a pleasure to meet you.

## 2020-10-16 ENCOUNTER — Encounter (INDEPENDENT_AMBULATORY_CARE_PROVIDER_SITE_OTHER): Payer: Self-pay

## 2021-01-27 ENCOUNTER — Emergency Department (HOSPITAL_BASED_OUTPATIENT_CLINIC_OR_DEPARTMENT_OTHER)
Admission: EM | Admit: 2021-01-27 | Discharge: 2021-01-27 | Disposition: A | Discharge status: Home / Self Care | Payer: Medicaid Other | Attending: Emergency Medicine | Admitting: Emergency Medicine

## 2021-01-27 ENCOUNTER — Emergency Department (HOSPITAL_BASED_OUTPATIENT_CLINIC_OR_DEPARTMENT_OTHER): Payer: Medicaid Other

## 2021-01-27 ENCOUNTER — Other Ambulatory Visit: Payer: Self-pay

## 2021-01-27 ENCOUNTER — Encounter (HOSPITAL_BASED_OUTPATIENT_CLINIC_OR_DEPARTMENT_OTHER): Payer: Self-pay | Admitting: *Deleted

## 2021-01-27 DIAGNOSIS — F17210 Nicotine dependence, cigarettes, uncomplicated: Secondary | ICD-10-CM | POA: Diagnosis not present

## 2021-01-27 DIAGNOSIS — W01198A Fall on same level from slipping, tripping and stumbling with subsequent striking against other object, initial encounter: Secondary | ICD-10-CM | POA: Insufficient documentation

## 2021-01-27 DIAGNOSIS — M7989 Other specified soft tissue disorders: Secondary | ICD-10-CM | POA: Diagnosis not present

## 2021-01-27 DIAGNOSIS — M25561 Pain in right knee: Secondary | ICD-10-CM | POA: Diagnosis not present

## 2021-01-27 DIAGNOSIS — Y92000 Kitchen of unspecified non-institutional (private) residence as  the place of occurrence of the external cause: Secondary | ICD-10-CM | POA: Diagnosis not present

## 2021-01-27 NOTE — ED Notes (Signed)
Patient Alert and oriented to baseline. Stable and ambulatory to baseline. Patient verbalized understanding of the discharge instructions.  Patient belongings were taken by the patient.   

## 2021-01-27 NOTE — ED Provider Notes (Signed)
[facility] EMERGENCY DEPARTMENT Provider Note   CSN: [id number] Arrival date & time: 01/27/21  1403     History Chief Complaint  Patient presents with   Knee Pain    [name] is a 35 y.o. female.  Presents today for right knee pain.  States that he tripped on a toy on the way to the kitchen yesterday around 5 AM.  Her right knee struck the ground.  She has been experiencing pain since, and last night she felt a pop in the injured knee followed by significant pain that is yet to resolve.  Patient having significant difficulty bearing weight at this time. Of note, patient does have history of ACL repair in this knee.  Denies numbness or tingling affected extremity.  The history is provided by the patient. No language interpreter was used.  Knee Pain Associated symptoms: no back pain, no fever and no neck pain       Past Medical History:  Diagnosis Date   Chronic back pain    Chronic knee pain    Drug abuse (HCC)    Hypertension    Long-term current use of methadone for opiate dependence (HCC)    PCOS (polycystic ovarian syndrome)    Scoliosis     Patient Active Problem List   Diagnosis Date Noted   Presence of IUD 01/08/2019   S/P cesarean section 01/05/2019   Chronic hypertension in pregnancy 07/23/2018   History of cesarean delivery, antepartum 07/23/2018   Methadone maintenance treatment affecting pregnancy, antepartum (HCC) 07/23/2018   ALLERGIC RHINITIS 04/01/2007   BACK PAIN WITH RADICULOPATHY 04/01/2007   POLYCYSTIC OVARIES 02/02/2007   BIPOLAR AFFECTIVE DISORDER 02/02/2007   Essential hypertension 02/02/2007   ASTHMA 02/02/2007   HX, PERSONAL, TOBACCO USE 02/02/2007    Past Surgical History:  Procedure Laterality Date   CESAREAN SECTION     CESAREAN SECTION N/A 01/05/2019   Procedure: CESAREAN SECTION AND IUD INSERTION;  Surgeon: Allie Bossier, MD;  Location: [facility] LD ORS;  Service: Obstetrics;  Laterality: N/A;   GANGLION CYST EXCISION      KNEE SURGERY     MULTIPLE TOOTH EXTRACTIONS     Pt has full upper and lower dentures     OB History     Gravida  2   Para  1   Term      Preterm  1   AB      Living  1      SAB      IAB      Ectopic      Multiple      Live Births  1           Family History  Problem Relation Age of Onset   Diabetes Mother    Heart disease Mother    Hypertension Mother    Varicose Veins Mother    Arthritis Mother    Anxiety disorder Mother    Depression Mother    Early death Mother    Alcohol abuse Father    Hypertension Father    Stroke Father    Anxiety disorder Father    Depression Father    Drug abuse Sister    Alcohol abuse Sister     Social History   Tobacco Use   Smoking status: Every Day    Packs/day: 0.25    Types: Cigarettes   Smokeless tobacco: Never  Vaping Use   Vaping Use: Never used  Substance Use Topics  Alcohol use: Not Currently   Drug use: Not Currently    Types: Oxycodone, Cocaine, Marijuana    Comment: oxycontin, opana, neurontin stopped 2011, on methadone now    Home Medications Prior to Admission medications   Medication Sig Start Date End Date Taking? Authorizing Provider  amLODipine (NORVASC) 10 MG tablet Take 10 mg by mouth daily.   Yes [provider]  buPROPion (WELLBUTRIN XL) 150 MG 24 hr tablet Take 150 mg by mouth daily.   Yes [provider]  cetirizine (ZYRTEC) 10 MG tablet Take 10 mg by mouth daily.   Yes [provider]  hydrochlorothiazide (HYDRODIURIL) 25 MG tablet Take 25 mg by mouth daily.   Yes [provider]  hydrOXYzine (VISTARIL) 25 MG capsule Take 25 mg by mouth 3 (three) times daily as needed for anxiety (BID PRN).   Yes [provider]  methadone (DOLOPHINE) 0.4 mg/mL SOLN Take 105 mg by mouth daily.    Yes [provider]  omeprazole (PRILOSEC) 20 MG capsule Take 20 mg by mouth daily.   Yes [provider]  pravastatin (PRAVACHOL) 20 MG tablet  Take 20 mg by mouth daily.   Yes [provider]  sertraline (ZOLOFT) 100 MG tablet Take 100 mg by mouth daily.   Yes [provider]  acetaminophen (TYLENOL) 325 MG tablet Take 2 tablets (650 mg total) by mouth every 4 (four) hours as needed for mild pain (temperature > 101.5.). 01/09/19   Mirian Mo, MD    Allergies    Patient has no known allergies.  Review of Systems   Review of Systems  Constitutional:  Negative for chills and fever.  Gastrointestinal:  Negative for nausea and vomiting.  Musculoskeletal:  Positive for arthralgias, gait problem, joint swelling and myalgias. Negative for back pain, neck pain and neck stiffness.  Skin:  Negative for color change, pallor, rash and wound.  Allergic/Immunologic: Negative for immunocompromised state.  Psychiatric/Behavioral:  Negative for confusion and decreased concentration.   All other systems reviewed and are negative.  Physical Exam Updated Vital Signs BP (!) 172/88 (BP Location: Left Arm)   Pulse (!) 102   Temp 98.9 F (37.2 C) (Oral)   Resp (!) 22   Ht 5\' 4"  (1.626 m)   Wt 122.5 kg   SpO2 95%   BMI 46.35 kg/m   Physical Exam Vitals and nursing note reviewed.  Constitutional:      General: She is not in acute distress.    Appearance: Normal appearance. She is normal weight. She is not ill-appearing, toxic-appearing or diaphoretic.  HENT:     Head: Normocephalic and atraumatic.  Cardiovascular:     Rate and Rhythm: Normal rate.     Pulses:          Dorsalis pedis pulses are 2+ on the right side and 2+ on the left side.       Posterior tibial pulses are 2+ on the right side and 2+ on the left side.  Pulmonary:     Effort: Pulmonary effort is normal. No respiratory distress.  Musculoskeletal:        General: Swelling, tenderness and signs of injury present. No deformity.     Cervical back: Normal range of motion.     Right knee: Swelling present. No deformity, effusion, erythema, ecchymosis,  lacerations, bony tenderness or crepitus. Decreased range of motion. Tenderness present. Normal alignment, normal meniscus and normal patellar mobility. Normal pulse.     Instability Tests: Anterior drawer test  positive. Posterior drawer test positive. Anterior Lachman test negative. Medial McMurray test negative and lateral McMurray test negative.     Left knee: Normal.     Comments: Tenderness noted to posterior right knee.  No laxity noted.  Pain with valgus stress.  Distal pulses intact.  Moderate swelling noted to right knee, no warmth or erythema noted.  Skin:    General: Skin is warm and dry.     Capillary Refill: Capillary refill takes less than 2 seconds.  Neurological:     General: No focal deficit present.     Mental Status: She is alert.  Psychiatric:        Mood and Affect: Mood normal.        Behavior: Behavior normal.    ED Results / Procedures / Treatments   Labs (all labs ordered are listed, but only abnormal results are displayed) Labs Reviewed - No data to display  EKG None  Radiology DG Knee Complete 4 Views Right  Result Date: 01/27/2021 CLINICAL DATA:  Fall, pain.  History of ACL repair EXAM: RIGHT KNEE - COMPLETE 4+ VIEW COMPARISON:  Knee radiographs 04/11/2007 FINDINGS: The patient is status post ACL repair with a single screw in the proximal tibia. There is no evidence of hardware related complication. There is no evidence of acute fracture or dislocation. Knee alignment is normal. There is minimal degenerative change about the knee. There is no significant effusion. IMPRESSION: No acute fracture or dislocation. Electronically Signed   By: Lesia Hausen M.D.   On: 01/27/2021 15:13    Procedures .Ortho Injury Treatment  Date/Time: 01/27/2021 4:30 PM Performed by: Silva Bandy, PA-C Authorized by: Silva Bandy, PA-C   Consent:    Consent obtained:  Verbal   Consent given by:  Patient   Risks discussed:  Restricted joint movement and stiffness    Alternatives discussed:  No treatmentInjury location: knee Location details: right knee Injury type: soft tissue Pre-procedure neurovascular assessment: neurovascularly intact Pre-procedure distal perfusion: normal Pre-procedure neurological function: normal Pre-procedure range of motion: reduced  Anesthesia: Local anesthesia used: no  Patient sedated: NoImmobilization: Knee Immobilizer. Splint Applied by: ED Tech     Medications Ordered in ED Medications - No data to display  ED Course  I have reviewed the triage vital signs and the nursing notes.  Pertinent labs & imaging results that were available during my care of the patient were reviewed by me and considered in my medical decision making (see chart for details).    MDM Rules/Calculators/A&P                         Patient presents with knee pain following injury yesterday morning.  Patient fell on the knee and heard a popping sound.  Imaging negative for fracture, dislocation, or damage to hardware placed from previous ACL repair.  She denies numbness or tingling in the affected extremity, pulses are intact distally.  No erythema or warmth noted to affected joint, low suspicion for septic arthritis at this time.  Patient is having difficulty ambulating on the affected joint.  Will apply knee immobilizer and provide a walker for assistance.  Pain managed in ED. Pt advised to follow up with orthopedics if symptoms persist for possibility of missed fracture diagnosis. Conservative therapy recommended and discussed. Patient will be dc home & is agreeable with above plan.   Final Clinical Impression(s) / ED Diagnoses Final diagnoses:  Acute pain of right knee  Rx / DC Orders ED Discharge Orders     None     An After Visit Summary was printed and given to the patient.    Vear Clock 01/27/21 1648    Gwyneth Sprout, MD 02/22/21 [phone]

## 2021-01-27 NOTE — Discharge Instructions (Signed)
Imaging performed on your knee today reveals no fracture, dislocation, or damage to hardware from previous surgery.  However, that does not exclude ligament or other soft tissue damage.  Follow-up with Dr. Jordan Likes and sports medicine for further evaluation of your injury.  In the meantime, rest, ice, compress, and elevate your knee.  Wear your immobilizer and use your walker while walking.  Alternate Tylenol and ibuprofen as needed for pain management.

## 2021-01-27 NOTE — ED Triage Notes (Signed)
Fell on Saturday at 0500 and hurt her right knee.  Pt stated that she heard a popped last night on her right knee.
